# Patient Record
Sex: Female | Born: 1970 | Race: White | Hispanic: No | Marital: Married | Smoking: Never smoker
Health system: Southern US, Community
[De-identification: ages and names within clinical notes are randomized; demographics above are authoritative.]

## PROBLEM LIST (undated history)

## (undated) DIAGNOSIS — F32A Depression, unspecified: Secondary | ICD-10-CM

## (undated) DIAGNOSIS — Z9889 Other specified postprocedural states: Secondary | ICD-10-CM

## (undated) DIAGNOSIS — F329 Major depressive disorder, single episode, unspecified: Secondary | ICD-10-CM

## (undated) DIAGNOSIS — Z87442 Personal history of urinary calculi: Secondary | ICD-10-CM

## (undated) DIAGNOSIS — O021 Missed abortion: Secondary | ICD-10-CM

## (undated) DIAGNOSIS — R011 Cardiac murmur, unspecified: Secondary | ICD-10-CM

## (undated) DIAGNOSIS — K219 Gastro-esophageal reflux disease without esophagitis: Secondary | ICD-10-CM

## (undated) DIAGNOSIS — R112 Nausea with vomiting, unspecified: Secondary | ICD-10-CM

## (undated) DIAGNOSIS — I10 Essential (primary) hypertension: Secondary | ICD-10-CM

## (undated) DIAGNOSIS — N200 Calculus of kidney: Secondary | ICD-10-CM

## (undated) DIAGNOSIS — E785 Hyperlipidemia, unspecified: Secondary | ICD-10-CM

## (undated) DIAGNOSIS — F419 Anxiety disorder, unspecified: Secondary | ICD-10-CM

## (undated) HISTORY — PX: URETHRA SURGERY: SHX824

## (undated) HISTORY — DX: Calculus of kidney: N20.0

## (undated) HISTORY — DX: Essential (primary) hypertension: I10

## (undated) HISTORY — PX: EYE SURGERY: SHX253

## (undated) HISTORY — DX: Hyperlipidemia, unspecified: E78.5

## (undated) HISTORY — DX: Gastro-esophageal reflux disease without esophagitis: K21.9

## (undated) HISTORY — DX: Anxiety disorder, unspecified: F41.9

---
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
## 1998-09-22 HISTORY — PX: LAPAROSCOPIC OVARIAN CYSTECTOMY: SUR786

## 1999-05-15 ENCOUNTER — Emergency Department (HOSPITAL_COMMUNITY): Admission: EM | Admit: 1999-05-15 | Discharge: 1999-05-15 | Payer: Self-pay | Admitting: Emergency Medicine

## 1999-06-28 ENCOUNTER — Ambulatory Visit (HOSPITAL_BASED_OUTPATIENT_CLINIC_OR_DEPARTMENT_OTHER): Admission: RE | Admit: 1999-06-28 | Discharge: 1999-06-28 | Payer: Self-pay | Admitting: Surgery

## 1999-09-23 HISTORY — PX: ENDOMETRIAL ABLATION: SHX621

## 2000-02-14 ENCOUNTER — Inpatient Hospital Stay (HOSPITAL_COMMUNITY): Admission: EM | Admit: 2000-02-14 | Discharge: 2000-02-14 | Payer: Self-pay | Admitting: *Deleted

## 2002-07-08 ENCOUNTER — Inpatient Hospital Stay (HOSPITAL_COMMUNITY): Admission: AD | Admit: 2002-07-08 | Discharge: 2002-07-08 | Payer: Self-pay | Admitting: Family Medicine

## 2003-09-23 HISTORY — PX: ANAL FISTULECTOMY: SHX1139

## 2004-06-27 ENCOUNTER — Ambulatory Visit (HOSPITAL_COMMUNITY): Admission: RE | Admit: 2004-06-27 | Discharge: 2004-06-27 | Payer: Self-pay | Admitting: Obstetrics

## 2004-06-27 IMAGING — CR DG CHEST 2V
2 series · 2 of 2 positions shown · non-contrast
Comparison: none

CLINICAL DATA: Cough.
 PA AND LATERAL CHEST:
 Heart and mediastinal contours are within normal limits.  The lung fields appear clear with no evidence for focal infiltrate or congestive failure.  Bony structures are intact.
 IMPRESSION
 No acute disease.

[view not recorded (1 of 2)]
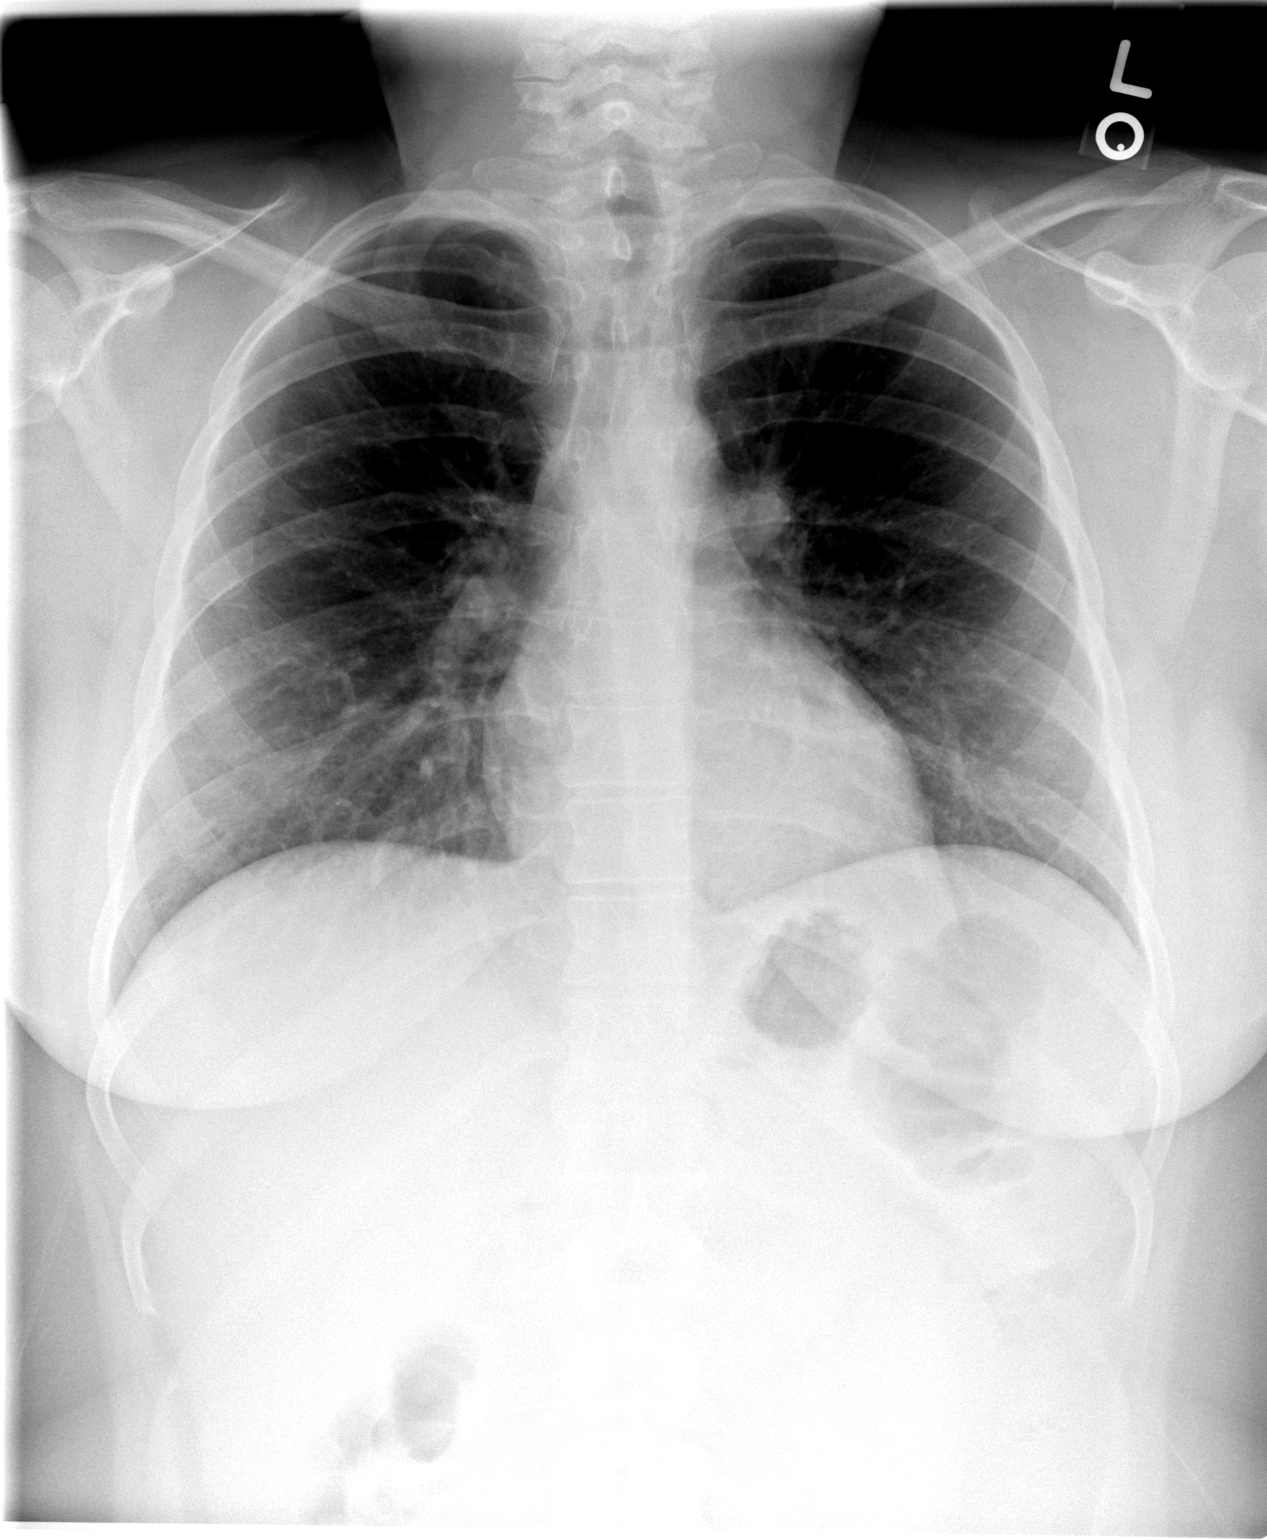

[view not recorded (2 of 2)]
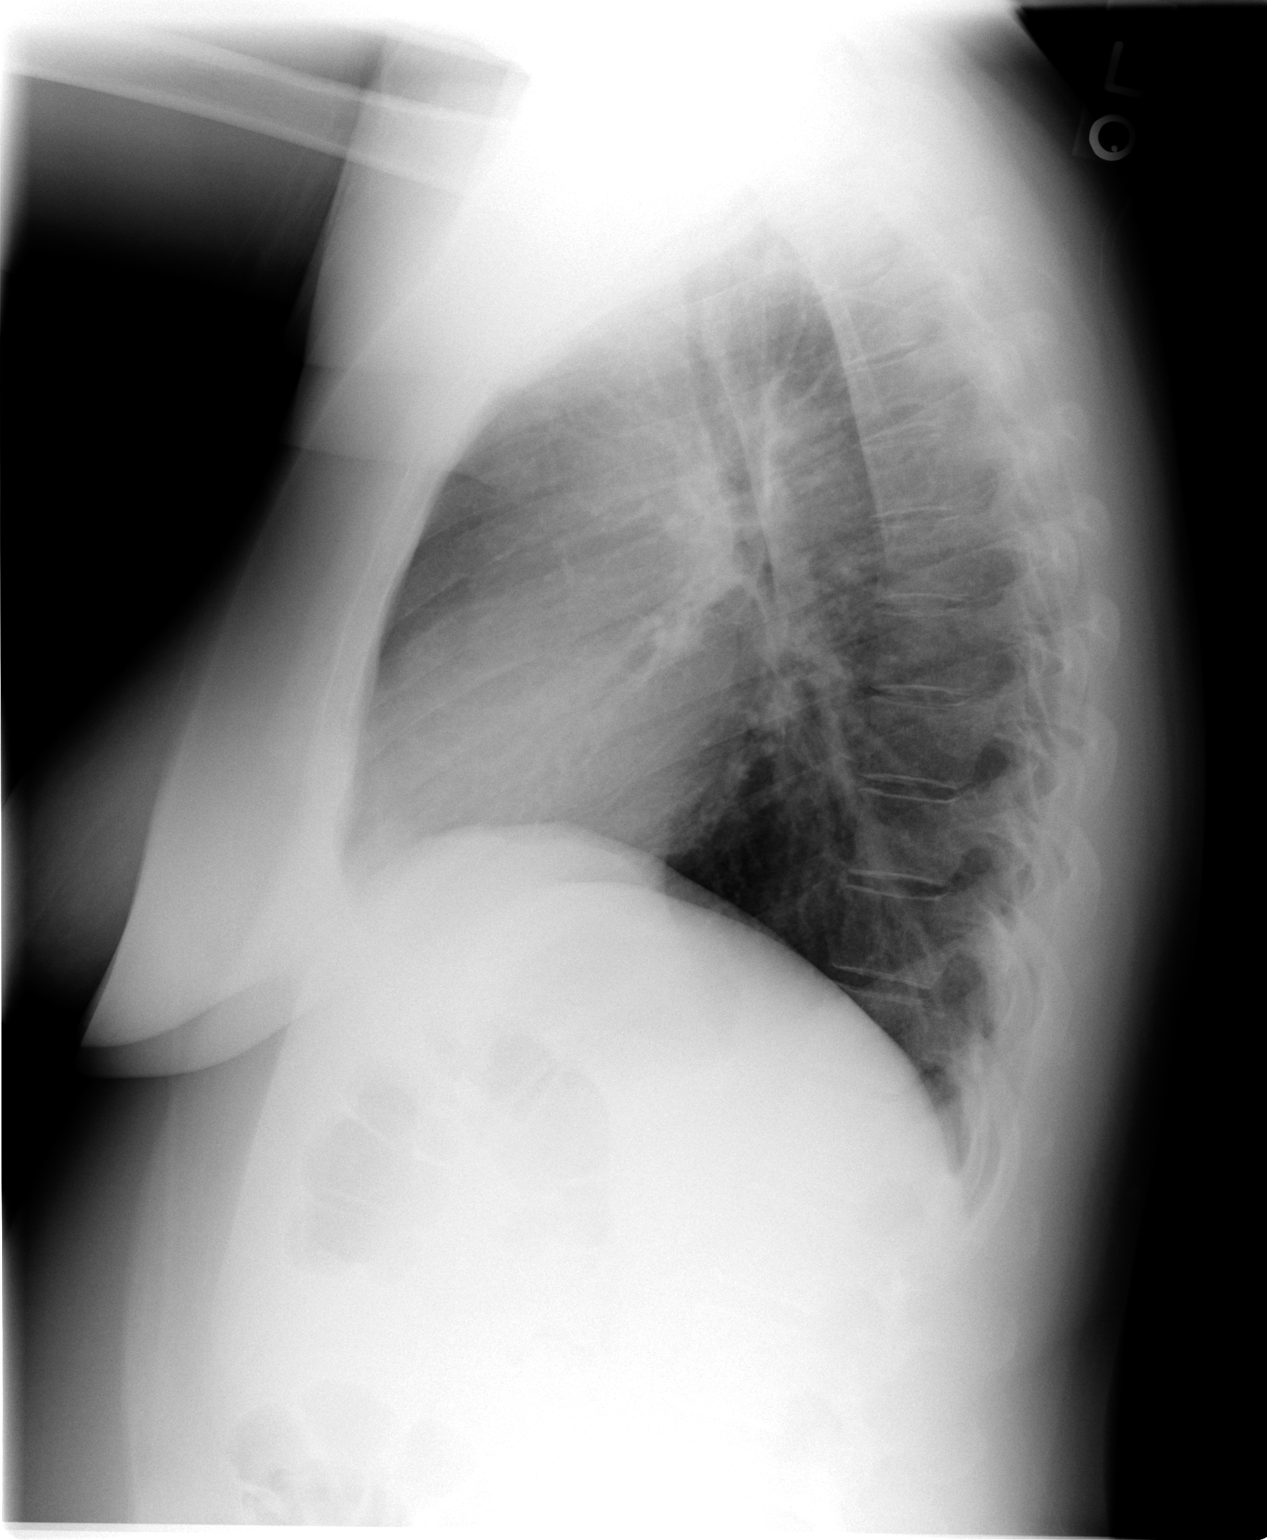

[2 of 2 positions shown; findings below may reference images not displayed]

## 2004-12-13 ENCOUNTER — Ambulatory Visit (HOSPITAL_COMMUNITY): Admission: RE | Admit: 2004-12-13 | Discharge: 2004-12-13 | Payer: Self-pay | Admitting: Gastroenterology

## 2005-02-28 ENCOUNTER — Encounter (INDEPENDENT_AMBULATORY_CARE_PROVIDER_SITE_OTHER): Payer: Self-pay | Admitting: Specialist

## 2005-02-28 ENCOUNTER — Ambulatory Visit (HOSPITAL_COMMUNITY): Admission: RE | Admit: 2005-02-28 | Discharge: 2005-02-28 | Payer: Self-pay | Admitting: Gastroenterology

## 2007-03-23 ENCOUNTER — Ambulatory Visit (HOSPITAL_BASED_OUTPATIENT_CLINIC_OR_DEPARTMENT_OTHER): Admission: RE | Admit: 2007-03-23 | Discharge: 2007-03-23 | Payer: Self-pay | Admitting: Urology

## 2007-09-14 ENCOUNTER — Other Ambulatory Visit: Payer: Self-pay

## 2007-09-14 ENCOUNTER — Emergency Department: Payer: Self-pay | Admitting: Emergency Medicine

## 2008-02-22 ENCOUNTER — Emergency Department (HOSPITAL_COMMUNITY): Admission: EM | Admit: 2008-02-22 | Discharge: 2008-02-22 | Payer: Self-pay | Admitting: Family Medicine

## 2008-09-22 HISTORY — PX: URETHRA SURGERY: SHX824

## 2008-11-23 ENCOUNTER — Ambulatory Visit (HOSPITAL_COMMUNITY): Admission: RE | Admit: 2008-11-23 | Discharge: 2008-11-23 | Payer: Self-pay | Admitting: Obstetrics and Gynecology

## 2008-12-24 ENCOUNTER — Inpatient Hospital Stay (HOSPITAL_COMMUNITY): Admission: AD | Admit: 2008-12-24 | Discharge: 2008-12-24 | Payer: Self-pay | Admitting: Obstetrics and Gynecology

## 2010-01-21 ENCOUNTER — Ambulatory Visit (HOSPITAL_COMMUNITY): Admission: RE | Admit: 2010-01-21 | Discharge: 2010-01-21 | Payer: Self-pay | Admitting: Obstetrics and Gynecology

## 2011-01-13 ENCOUNTER — Other Ambulatory Visit (HOSPITAL_COMMUNITY): Payer: Self-pay | Admitting: Obstetrics and Gynecology

## 2011-01-13 DIAGNOSIS — Z1231 Encounter for screening mammogram for malignant neoplasm of breast: Secondary | ICD-10-CM

## 2011-01-29 ENCOUNTER — Ambulatory Visit (HOSPITAL_COMMUNITY): Payer: Commercial Managed Care - PPO

## 2011-02-04 NOTE — Op Note (Signed)
NAME:  Megan Carrillo, FELTES                  ACCOUNT NO.:  [id number]   MEDICAL RECORD NO.:  [id number]          PATIENT TYPE:  AMB   LOCATION:  [facility]                         FACILITY:  [facility]   PHYSICIAN:  Sigmund I. Patsi Sears, M.D.DATE OF BIRTH:  10-Dec-1970   DATE OF PROCEDURE:  03/23/2007  DATE OF DISCHARGE:                               OPERATIVE REPORT   PREOPERATIVE DIAGNOSIS:  Left lower ureteral calculus.   POSTOPERATIVE DIAGNOSIS:  Left lower ureteral calculus.   OPERATION:  Cystourethroscopy, left retrograde pyelogram with  interpretation, left double-J catheter, balloon dilation of left ureter.   SURGEON:  Sigmund I. Patsi Sears, M.D.   PREPARATION:  Appropriate preanesthesia, the patient is brought to the  operating room, placed on the operating room table in the dorsal supine  position where general LMA anesthesia was induced.  She was then  replaced in the dorsal lithotomy position where the pubis was prepped  with Betadine solution and draped in the usual fashion.   HISTORY:  This 40 year old [employer] employee was originally  evaluated with left flank pain beginning March 16, 2007, with CT scan  showing a 3 mm impacted left lower ureteral calculus.  The patient was  treated with Toradol and Flomax, but was unable to pass the stone.  She  is now for extraction of her stone.  She has continued to have nausea  and continued pain and spasm.   PROCEDURE:  Cystourethroscopy was accomplished and showed that the  patient has a normal appearing bladder.  Left retrograde pyelogram  was  performed which shows an eccentric left ureteral tunnel, and it is  difficult to get into the ureter.  The retrograde was performed and  showed a dilated ureter above the level of the intramural ureter.  No  stone was identified.  The ureteroscope could not be passed through a  very tight intramural ureter.  This was balloon dilated with a 4 cm  balloon dilator for 3 minutes.  The ureteroscope  was then easily passed  into the lower ureter.  Identification of scar and inflammation of the  lower ureter is photodocumented, the upper ureter appeared dilated but  normal.  It was elected to place a double-J catheter.  Therefore, a 24  cm x 4.8 [place] was passed and coiled in the renal pelvis and in the  bladder under fluoroscopic control.  The patient tolerated the procedure  well.  She was awakened after being given Toradol and taken to recovery  in good condition.      Sigmund I. Patsi Sears, M.D.  Electronically Signed     SIT/MEDQ  D:  03/23/2007  T:  03/23/2007  Job:  [id number]

## 2011-02-07 NOTE — Op Note (Signed)
Megan Carrillo, JELINEK                  ACCOUNT NO.:  [id number]   MEDICAL RECORD NO.:  [id number]          PATIENT TYPE:  AMB   LOCATION:  ENDO                         FACILITY:  [facility]   PHYSICIAN:  Bernette Redbird, M.D.   DATE OF BIRTH:  [birth year]   DATE OF PROCEDURE:  02/28/2005  DATE OF DISCHARGE:                                 OPERATIVE REPORT   PROCEDURE:  Upper endoscopy with biopsies.   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   FINDINGS:  Essentially normal exam.   DESCRIPTION OF PROCEDURE:  The nature, purpose and risks of the procedure  had been discussed with the patient who provided written consent. Sedation  for this procedure and the sigmoidoscopy which followed it totaled fentanyl  100 mcg and Versed 10 mg IV without arrhythmias or desaturation. The Olympus  video endoscope was passed under direct vision. The larynx looked normal.  The esophagus was readily entered. There was a minimal esophageal ring above  a very small 1-2 cm hiatal hernia without any evidence of definite reflux  esophagitis and certainly without evidence of active inflammation or  Barrett's esophagus, nor any varices, infection or neoplasia. The stomach  contained no significant residual and appeared to have normal contractility.  In the antrum of the stomach was a slight submucosal fullness or raised area  measuring perhaps 2 cm across with a slit-like dimpled opening as though  this were gastric diverticulum or perhaps a slightly atypical appearance for  a pancreatic rest. Motility through this area was normal suggesting that  there was no significant underlying process causing fixation of the gastric  wall. No gastritis, erosions or ulcers were seen and there were no frank  gastric masses. Retroflexed viewing of the cardia showed just a slightly  patulous diaphragmatic hiatus. The pylorus, duodenal bulb, and  second  duodenum looked normal.   Biopsies were then obtained from the duodenum, from the roof and opening of  the raised area in the antrum, and randomly from the antrum prior to removal  of the scope. The patient tolerated the procedure well and there were no  apparent complications.   IMPRESSION:  1.  Reflux, without any endoscopically-evident adverse sequelae.  2.  Nonspecific abdominal symptoms.  3.  Possible nodule or submucosal lesion such as pancreatic rest in the      antrum of the stomach of dubious clinical relevance.   PLAN:  Await pathology results. Consider trial of prokinetic therapy if the  patient remains symptomatic.      RB/MEDQ  D:  02/28/2005  T:  02/28/2005  Job:  [id number]

## 2011-02-07 NOTE — Op Note (Signed)
. [facility]  Patient:    Megan Carrillo                            MRN: [id number] Proc. Date: 06/28/99 Adm. Date:  [id number] Disc. Date: [id number] Attending:  Charlton Haws                           Operative Report  ACCOUNT [id number]  OFFICE MRN [id number]  PREOPERATIVE DIAGNOSIS:  Fistula in ano.  POSTOPERATIVE DIAGNOSIS:  Fistula in ano.  OPERATION:  Anoscopy and anal fistulotomy.  SURGEON:  Currie Paris, M.D.  ANESTHESIA:  General.  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  She was admitted for surgical correction.  DESCRIPTION OF PROCEDURE:  The patient was brought to the operating room and after satisfactory general anesthesia had been obtained she was placed in the lithotomy position.  Digital rectal examination was unremarkable.  Anoscopy showed some minimal hemorrhoidal disease.  There was a raw surface area on the left anterior side just inside the mucosal border.  At this point I placed a probe into the tract and with some gentle manipulation was able to enter the anal canal in the area where we had noticed the raw surface.  Using coagulation current with cautery I opened the fistula tract over the probe. There was a large amount of chronic granulation tissue present.  This was all curetted out after we had a smooth clean surface.  The tract entered and went through a portion of the sphincter muscle but not completely through it so I did not need to cut completely through the sphincter muscle to achieve control of the fistula.  Once everything appeared to be dry, I injected the area with about 15 cc of plain 0.25% Marcaine with epinephrine for postoperative analgesia.  I then packed it with some Gelfoam.  The patient tolerated the procedure well.  There were no operative complications. DD:   06/28/99 TD:  07/01/99 Job: [id number]

## 2011-02-07 NOTE — Op Note (Signed)
Megan Carrillo, Megan Carrillo                  ACCOUNT NO.:  [id number]   MEDICAL RECORD NO.:  [id number]          PATIENT TYPE:  AMB   LOCATION:  ENDO                         FACILITY:  [facility]   PHYSICIAN:  Bernette Redbird, M.D.   DATE OF BIRTH:  1971/05/17   DATE OF PROCEDURE:  02/28/2005  DATE OF DISCHARGE:                                 OPERATIVE REPORT   PROCEDURE:  Flexible sigmoidoscopy.   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   FINDINGS:  Normal exam.   PROCEDURE:  The nature, purpose, and risks of the procedure have been  discussed with the patient who provided written consent.  The prep was, I  believe, some Fleet enemas the patient had administered at home.  Sedation  for this procedure and the upper endoscopy which preceded it totaled  fentanyl 100 mcg and Versed 10 mg IV without arrhythmias or desaturation.  The Olympus video endoscope which had been used for her upper procedure was  also used for this exam and was advanced easily was to about 58 cm,  whereupon pullback was initiated.  The quality of the prep was excellent,  and it was felt that all areas were well seen.   This was a normal examination.  There was no evidence of proctitis or  colitis and, in fact, the vascular pattern of the rectosigmoid and colonic  mucosa was very normal and clear.  In the rectum, there was some minimal  contact hemorrhage.  No granularity or exudate were observed.  Retroflexion  was unremarkable.  Pullout through the anal canal demonstrated just minimal  internal hemorrhoids.   No biopsies were obtained.  The patient tolerated the procedure well, and  there no apparent complications.   IMPRESSION:  Normal flexible sigmoidoscopy, without source of rectal  bleeding identified (569.3).  The bleeding was presumably of hemorrhoidal  origin.   PLAN:  No follow-up for this condition needed.       RB/MEDQ  D:  02/28/2005  T:  02/28/2005  Job:   [id number]

## 2011-02-21 ENCOUNTER — Ambulatory Visit (HOSPITAL_COMMUNITY): Payer: Commercial Managed Care - PPO | Attending: Obstetrics and Gynecology

## 2011-07-08 LAB — POCT PREGNANCY, URINE
Operator id: 268271
Preg Test, Ur: NEGATIVE

## 2011-07-08 LAB — POCT HEMOGLOBIN-HEMACUE
Hemoglobin: 13.4
Operator id: 268271

## 2011-12-22 ENCOUNTER — Encounter (HOSPITAL_COMMUNITY): Payer: Self-pay | Admitting: *Deleted

## 2011-12-22 ENCOUNTER — Emergency Department (INDEPENDENT_AMBULATORY_CARE_PROVIDER_SITE_OTHER): Payer: Commercial Managed Care - PPO

## 2011-12-22 ENCOUNTER — Emergency Department (INDEPENDENT_AMBULATORY_CARE_PROVIDER_SITE_OTHER)
Admission: EM | Admit: 2011-12-22 | Discharge: 2011-12-22 | Disposition: A | Discharge status: Home / Self Care | Payer: PRIVATE HEALTH INSURANCE | Source: Home / Self Care | Attending: Family Medicine | Admitting: Family Medicine

## 2011-12-22 ENCOUNTER — Emergency Department (HOSPITAL_COMMUNITY): Payer: Self-pay

## 2011-12-22 DIAGNOSIS — S6000XA Contusion of unspecified finger without damage to nail, initial encounter: Secondary | ICD-10-CM

## 2011-12-22 DIAGNOSIS — X500XXA Overexertion from strenuous movement or load, initial encounter: Secondary | ICD-10-CM

## 2011-12-22 NOTE — ED Provider Notes (Signed)
History     CSN: [id number]  Arrival date & time 12/22/11  1478   First MD Initiated Contact with Patient 12/22/11 [phone]      No chief complaint on file.   (Consider location/radiation/quality/duration/timing/severity/associated sxs/prior treatment) HPI Comments: Megan Carrillo presents for evaluation of worsening pain, swelling, and bruising in her left fifth digit of her hand. She reports, that she hyperflexed it as she was reaching for a door on Friday night. She's been taking Advil since that time with minimal improvement in pain. She reports most of the pain is at the PIP joint. There is no bruising on the dorsum of the PIP joint.  Patient is a 41 y.o. female presenting with hand pain. The history is provided by the patient.  Hand Pain This is a new problem. The current episode started more than 2 days ago. The problem occurs constantly. The problem has been gradually worsening. The symptoms are aggravated by bending. The symptoms are relieved by nothing.    No past medical history on file.  No past surgical history on file.  No family history on file.  History  Substance Use Topics  . Smoking status: Not on file  . Smokeless tobacco: Not on file  . Alcohol Use: Not on file    OB History    No data available      Review of Systems  Constitutional: Negative.   HENT: Negative.   Eyes: Negative.   Respiratory: Negative.   Cardiovascular: Negative.   Gastrointestinal: Negative.   Genitourinary: Negative.   Musculoskeletal: Negative.        Finger pain, LEFT 5th digit  Skin: Negative.   Neurological: Negative.     Allergies  Review of patient's allergies indicates not on file.  Home Medications  No current outpatient prescriptions on file.  BP 128/80  Pulse 94  Temp(Src) 97.9 F (36.6 C) (Oral)  Resp 14  SpO2 99%  Physical Exam  Nursing note and vitals reviewed. Constitutional: She is oriented to person, place, and time. She appears well-developed and  well-nourished.  HENT:  Head: Normocephalic and atraumatic.  Eyes: EOM are normal.  Neck: Normal range of motion.  Pulmonary/Chest: Effort normal.  Musculoskeletal: Normal range of motion.       Left hand: She exhibits tenderness and bony tenderness. She exhibits normal range of motion and normal capillary refill.       Hands: Neurological: She is alert and oriented to person, place, and time.  Skin: Skin is warm and dry.  Psychiatric: Her behavior is normal.    ED Course  Procedures (including critical care time)  Labs Reviewed - No data to display No results found.   No diagnosis found.    MDM  Xray reviewed by radiologist and myself; no acute fracture or dislocation; likely contusion, given splint        Renaee Munda, MD 12/22/11 1023

## 2011-12-22 NOTE — Discharge Instructions (Signed)
Your xray was negative for any fracture or dislocation. Wear splint with activity for the next 48 to 72 hours. Remove and assess pain with activity. If pain is improved or resolved, discontinue splint. If pain persists, continue splint, with activity, until pain-free. If pain persists longer than 2 to 3 weeks, return for re-evaluation. Use an over the counter anti-inflammatory such as Aleve, one to two tablets with meals, every 12 hours, or high-dose ibuprofen, 800 mg every 8 hours, for baseline pain control.

## 2011-12-22 NOTE — ED Notes (Signed)
Pt states that she jammed her left little finger on a door at work on Saturday night.  States she's tried Advil and splinting without relief.  Having throbbing pain and occasional sharp, shooting pain in finger.  Minimal swelling noted.  No obvious deformity noted.

## 2012-01-15 ENCOUNTER — Other Ambulatory Visit: Payer: Self-pay | Admitting: Gastroenterology

## 2012-01-15 ENCOUNTER — Other Ambulatory Visit (HOSPITAL_COMMUNITY): Payer: Self-pay | Admitting: Gastroenterology

## 2012-01-15 DIAGNOSIS — R1013 Epigastric pain: Secondary | ICD-10-CM

## 2012-01-20 ENCOUNTER — Ambulatory Visit (HOSPITAL_COMMUNITY)
Admission: RE | Admit: 2012-01-20 | Discharge: 2012-01-20 | Disposition: A | Discharge status: Home / Self Care | Payer: 59 | Source: Ambulatory Visit | Attending: Gastroenterology | Admitting: Gastroenterology

## 2012-01-20 DIAGNOSIS — R1013 Epigastric pain: Secondary | ICD-10-CM | POA: Insufficient documentation

## 2012-01-20 DIAGNOSIS — R1110 Vomiting, unspecified: Secondary | ICD-10-CM | POA: Insufficient documentation

## 2012-01-27 ENCOUNTER — Telehealth (INDEPENDENT_AMBULATORY_CARE_PROVIDER_SITE_OTHER): Payer: Self-pay | Admitting: Surgery

## 2012-01-27 NOTE — Telephone Encounter (Signed)
Patient having generalized abdominal pain. Has been seeing Dr Matthias Hughs has had an [place] of her gallbladder that showed no gallstones. They have ruled out kidney stones. She has had an echo which was negative. She has a follow up with Dr Matthias Hughs on 02/12/12. I advised patient to keep appt with him and if they find a surgical problem we would be happy to see her. She understands and will call if needed.

## 2012-12-23 ENCOUNTER — Emergency Department: Payer: Self-pay | Admitting: Emergency Medicine

## 2012-12-23 LAB — COMPREHENSIVE METABOLIC PANEL
Albumin: 3.7 g/dL (ref 3.4–5.0)
Alkaline Phosphatase: 56 U/L (ref 50–136)
Anion Gap: 7 (ref 7–16)
BUN: 8 mg/dL (ref 7–18)
Bilirubin,Total: 0.5 mg/dL (ref 0.2–1.0)
Calcium, Total: 8.7 mg/dL (ref 8.5–10.1)
Chloride: 105 mmol/L (ref 98–107)
Co2: 26 mmol/L (ref 21–32)
Creatinine: 0.69 mg/dL (ref 0.60–1.30)
EGFR (African American): >60
EGFR (Non-African Amer.): >60
Glucose: 93 mg/dL (ref 65–99)
Osmolality: 274 (ref 275–301)
Potassium: 3.6 mmol/L (ref 3.5–5.1)
SGOT(AST): 12 U/L — ABNORMAL LOW (ref 15–37)
SGPT (ALT): 23 U/L (ref 12–78)
Sodium: 138 mmol/L (ref 136–145)
Total Protein: 8.1 g/dL (ref 6.4–8.2)

## 2012-12-23 LAB — PREGNANCY, URINE: Pregnancy Test, Urine: NEGATIVE m[IU]/mL

## 2012-12-23 LAB — URINALYSIS, COMPLETE
Bacteria: NONE SEEN
Bilirubin,UR: NEGATIVE
Blood: NEGATIVE
Glucose,UR: NEGATIVE mg/dL (ref 0–75)
Ketone: NEGATIVE
Leukocyte Esterase: NEGATIVE
Nitrite: NEGATIVE
Ph: 6 (ref 4.5–8.0)
Protein: NEGATIVE
RBC,UR: 1 /HPF (ref 0–5)
Specific Gravity: 1.021 (ref 1.003–1.030)
Squamous Epithelial: 1
WBC UR: 1 /HPF (ref 0–5)

## 2012-12-23 LAB — CBC
HCT: 40.5 % (ref 35.0–47.0)
HGB: 13.9 g/dL (ref 12.0–16.0)
MCH: 29 pg (ref 26.0–34.0)
MCHC: 34.4 g/dL (ref 32.0–36.0)
MCV: 84 fL (ref 80–100)
Platelet: 288 10*3/uL (ref 150–440)
RBC: 4.81 10*6/uL (ref 3.80–5.20)
RDW: 13.1 % (ref 11.5–14.5)
WBC: 8.2 10*3/uL (ref 3.6–11.0)

## 2012-12-23 LAB — LIPASE, BLOOD: Lipase: 145 U/L (ref 73–393)

## 2013-02-27 ENCOUNTER — Emergency Department: Payer: Self-pay | Admitting: Emergency Medicine

## 2013-07-08 ENCOUNTER — Other Ambulatory Visit (HOSPITAL_COMMUNITY): Payer: Self-pay | Admitting: Obstetrics and Gynecology

## 2013-07-08 DIAGNOSIS — Z1231 Encounter for screening mammogram for malignant neoplasm of breast: Secondary | ICD-10-CM

## 2013-11-03 ENCOUNTER — Ambulatory Visit (HOSPITAL_COMMUNITY): Payer: 59 | Attending: Obstetrics and Gynecology

## 2014-09-28 ENCOUNTER — Inpatient Hospital Stay (HOSPITAL_COMMUNITY)
Admission: AD | Admit: 2014-09-28 | Discharge: 2014-09-28 | Discharge status: Against Medical Advice | Payer: BLUE CROSS/BLUE SHIELD | Source: Ambulatory Visit | Attending: Obstetrics and Gynecology | Admitting: Obstetrics and Gynecology

## 2014-09-28 DIAGNOSIS — Z5321 Procedure and treatment not carried out due to patient leaving prior to being seen by health care provider: Secondary | ICD-10-CM | POA: Insufficient documentation

## 2014-09-28 DIAGNOSIS — R109 Unspecified abdominal pain: Secondary | ICD-10-CM | POA: Insufficient documentation

## 2014-09-28 DIAGNOSIS — N809 Endometriosis, unspecified: Secondary | ICD-10-CM | POA: Insufficient documentation

## 2014-09-28 LAB — URINALYSIS, ROUTINE W REFLEX MICROSCOPIC
Bilirubin Urine: NEGATIVE
Glucose, UA: NEGATIVE mg/dL
Hgb urine dipstick: NEGATIVE
Ketones, ur: NEGATIVE mg/dL
Leukocytes, UA: NEGATIVE
Nitrite: NEGATIVE
Protein, ur: NEGATIVE mg/dL
Specific Gravity, Urine: <1.005 — ABNORMAL LOW (ref 1.005–1.030)
Urobilinogen, UA: 0.2 mg/dL (ref 0.0–1.0)
pH: 6 (ref 5.0–8.0)

## 2014-09-28 LAB — POCT PREGNANCY, URINE: Preg Test, Ur: NEGATIVE

## 2014-09-28 NOTE — MAU Note (Signed)
Sudden onset of mid & lower abd pain 1 1/2 hours ago.  Feeling nauseated, having rectal pressure.  Denies vomiting or diarrhea.

## 2014-09-28 NOTE — MAU Note (Signed)
Pt states she is on continuous birth control for endometriosis.

## 2014-09-28 NOTE — MAU Note (Signed)
Pt states she wants to leave due to wait time & states she is feeling better.  AMA form signed.

## 2015-01-18 ENCOUNTER — Encounter (HOSPITAL_COMMUNITY): Payer: Self-pay | Admitting: *Deleted

## 2015-01-18 ENCOUNTER — Emergency Department (HOSPITAL_COMMUNITY): Payer: BC Managed Care – PPO

## 2015-01-18 ENCOUNTER — Emergency Department (HOSPITAL_COMMUNITY)
Admission: EM | Admit: 2015-01-18 | Discharge: 2015-01-18 | Disposition: A | Discharge status: Home / Self Care | Payer: BC Managed Care – PPO | Attending: Emergency Medicine | Admitting: Emergency Medicine

## 2015-01-18 DIAGNOSIS — R51 Headache: Secondary | ICD-10-CM | POA: Diagnosis not present

## 2015-01-18 DIAGNOSIS — K219 Gastro-esophageal reflux disease without esophagitis: Secondary | ICD-10-CM | POA: Diagnosis not present

## 2015-01-18 DIAGNOSIS — R42 Dizziness and giddiness: Secondary | ICD-10-CM | POA: Insufficient documentation

## 2015-01-18 DIAGNOSIS — R531 Weakness: Secondary | ICD-10-CM | POA: Insufficient documentation

## 2015-01-18 DIAGNOSIS — R079 Chest pain, unspecified: Secondary | ICD-10-CM | POA: Insufficient documentation

## 2015-01-18 DIAGNOSIS — R200 Anesthesia of skin: Secondary | ICD-10-CM | POA: Diagnosis not present

## 2015-01-18 DIAGNOSIS — Z8742 Personal history of other diseases of the female genital tract: Secondary | ICD-10-CM | POA: Diagnosis not present

## 2015-01-18 DIAGNOSIS — R011 Cardiac murmur, unspecified: Secondary | ICD-10-CM | POA: Diagnosis not present

## 2015-01-18 HISTORY — DX: Cardiac murmur, unspecified: R01.1

## 2015-01-18 LAB — CBC WITH DIFFERENTIAL/PLATELET
Basophils Absolute: 0 10*3/uL (ref 0.0–0.1)
Basophils Relative: 0 % (ref 0–1)
Eosinophils Absolute: 0 10*3/uL (ref 0.0–0.7)
Eosinophils Relative: 1 % (ref 0–5)
HCT: 37.6 % (ref 36.0–46.0)
Hemoglobin: 12.9 g/dL (ref 12.0–15.0)
Lymphocytes Relative: 19 % (ref 12–46)
Lymphs Abs: 1.3 10*3/uL (ref 0.7–4.0)
MCH: 28.8 pg (ref 26.0–34.0)
MCHC: 34.3 g/dL (ref 30.0–36.0)
MCV: 83.9 fL (ref 78.0–100.0)
Monocytes Absolute: 0.4 10*3/uL (ref 0.1–1.0)
Monocytes Relative: 6 % (ref 3–12)
Neutro Abs: 5.2 10*3/uL (ref 1.7–7.7)
Neutrophils Relative %: 74 % (ref 43–77)
Platelets: 272 10*3/uL (ref 150–400)
RBC: 4.48 MIL/uL (ref 3.87–5.11)
RDW: 12.6 % (ref 11.5–15.5)
WBC: 7 10*3/uL (ref 4.0–10.5)

## 2015-01-18 LAB — COMPREHENSIVE METABOLIC PANEL
ALT: 13 U/L (ref 0–35)
AST: 14 U/L (ref 0–37)
Albumin: 3.6 g/dL (ref 3.5–5.2)
Alkaline Phosphatase: 41 U/L (ref 39–117)
Anion gap: 8 (ref 5–15)
BUN: <5 mg/dL — ABNORMAL LOW (ref 6–23)
CO2: 24 mmol/L (ref 19–32)
Calcium: 9.2 mg/dL (ref 8.4–10.5)
Chloride: 106 mmol/L (ref 96–112)
Creatinine, Ser: 0.78 mg/dL (ref 0.50–1.10)
GFR calc Af Amer: >90 mL/min (ref >90)
GFR calc non Af Amer: >90 mL/min (ref >90)
Glucose, Bld: 95 mg/dL (ref 70–99)
Potassium: 3.6 mmol/L (ref 3.5–5.1)
Sodium: 138 mmol/L (ref 135–145)
Total Bilirubin: 0.2 mg/dL — ABNORMAL LOW (ref 0.3–1.2)
Total Protein: 7 g/dL (ref 6.0–8.3)

## 2015-01-18 LAB — I-STAT TROPONIN, ED
Troponin i, poc: 0 ng/mL (ref 0.00–0.08)
Troponin i, poc: 0 ng/mL (ref 0.00–0.08)

## 2015-01-18 MED ORDER — IBUPROFEN 200 MG PO TABS
600.0000 mg | ORAL_TABLET | Freq: Once | ORAL | Status: AC
  Administered 2015-01-18: 600 mg via ORAL
  Filled 2015-01-18: qty 3

## 2015-01-18 NOTE — ED Notes (Signed)
Per EMS pt from doctors office with c/o numbness in right arm x a couple of weeks intermittently. Dizzines began today. VSS. CBG 89. Stroke screen negative. Doctor concerned about TIA.

## 2015-01-18 NOTE — ED Provider Notes (Signed)
CSN: [id number]     Arrival date & time 01/18/15  1353 History   First MD Initiated Contact with Patient 01/18/15 1357     Chief Complaint  Patient presents with  . Numbness     (Consider location/radiation/quality/duration/timing/severity/associated sxs/prior Treatment) HPI Megan Carrillo is a 44 y.o. female with history of acid reflux, otherwise healthy, presents to emergency department complaining of intermittent right arm numbness and weakness for the last several weeks. She states symptoms started with "tingling sensation" in the hand. States sometimes it wakes her up from sleep, but sometimes it is during the day. States symptoms have gradually been getting worse. She made an apt with orthopedics specialist which is tomorrow. Today she states she noticed that weakness in that hand is not going away and she felt dizzy, had slight headache, and developed chest tightness. This made her want to go see her doctor today. Pt denies prior cardiac or vascular hx. She does have family hx of cardiac disease in her father. She states she has been under a lot of stress recently. States she lost her mother few weeks ago who passed away from cancer, and her father had open heart surgery. Pt states she had some work related stress as well. Pt denies any arm pain. Denies any neck pain. No neck injuries. No prior similar symptoms.   Past Medical History  Diagnosis Date  . Acid reflux   . Endometriosis   . Heart murmur    Past Surgical History  Procedure Laterality Date  . Ovarian cyst removal    . Eye surgery     No family history on file. History  Substance Use Topics  . Smoking status: Never Smoker   . Smokeless tobacco: Not on file  . Alcohol Use: No   OB History    No data available     Review of Systems  Constitutional: Negative for fever and chills.  Respiratory: Positive for chest tightness. Negative for cough and shortness of breath.   Cardiovascular: Negative for chest pain,  palpitations and leg swelling.  Gastrointestinal: Negative for nausea, vomiting, abdominal pain and diarrhea.  Genitourinary: Negative for dysuria and flank pain.  Musculoskeletal: Negative for myalgias, arthralgias, neck pain and neck stiffness.  Skin: Negative for rash.  Neurological: Positive for dizziness, weakness, light-headedness, numbness and headaches.  All other systems reviewed and are negative.     Allergies  Sulfa antibiotics  Home Medications   Prior to Admission medications   Medication Sig Start Date End Date Taking? Authorizing Provider  B Complex Vitamins (VITAMIN B COMPLEX PO) Take 1 tablet by mouth at bedtime.    Yes Historical Provider, MD  cetirizine (ZYRTEC) 10 MG tablet Take 10 mg by mouth at bedtime.    Yes Historical Provider, MD  cholecalciferol (VITAMIN D) 1000 UNITS tablet Take 1,000 Units by mouth at bedtime.   Yes Historical Provider, MD  ibuprofen (ADVIL,MOTRIN) 200 MG tablet Take 200 mg by mouth every 6 (six) hours as needed for moderate pain.   Yes Historical Provider, MD  Norgestimate-Eth Estradiol (MONONESSA PO) Take by mouth 1 day or 1 dose.   Yes Historical Provider, MD  pantoprazole (PROTONIX) 40 MG tablet Take 40 mg by mouth at bedtime.   Yes Historical Provider, MD   BP 119/57 mmHg  Pulse 87  Temp(Src) 98.4 F (36.9 C) (Oral)  Resp 18  SpO2 99% Physical Exam  Constitutional: She is oriented to person, place, and time. She appears well-developed and well-nourished. No distress.  HENT:  Head: Normocephalic.  Eyes: Conjunctivae are normal.  Neck: Neck supple.  Cardiovascular: Normal rate, regular rhythm and normal heart sounds.   Pulmonary/Chest: Effort normal and breath sounds normal. No respiratory distress. She has no wheezes. She has no rales.  Abdominal: Soft. Bowel sounds are normal. She exhibits no distension. There is no tenderness. There is no rebound.  Musculoskeletal: She exhibits no edema.  Full rom of right arm at all joints.  No ttp over cervical spine  Neurological: She is alert and oriented to person, place, and time.  5/5 and equal upper and lower extremity strength bilaterally. 5/5 strength of right deltoid, right bicep, right triceps, right forearm muscles, right grip. Equal grip strength bilaterally. Sensation intact in right arm. Normal finger to nose and heel to shin. No pronator drift.   Skin: Skin is warm and dry.  Psychiatric: She has a normal mood and affect. Her behavior is normal.  Nursing note and vitals reviewed.   ED Course  Procedures (including critical care time) Labs Review Labs Reviewed  CBC WITH DIFFERENTIAL/PLATELET  COMPREHENSIVE METABOLIC PANEL  I-STAT TROPOININ, ED    Imaging Review No results found.   EKG Interpretation   Date/Time:  Thursday January 18 2015 14:54:04 EDT Ventricular Rate:  87 PR Interval:  143 QRS Duration: 85 QT Interval:  387 QTC Calculation: 466 R Axis:   2 Text Interpretation:  Sinus rhythm No old tracing to compare Confirmed by  BELFI  MD, MELANIE ([id number]) on 01/18/2015 2:56:46 PM      MDM   Final diagnoses:  Right arm numbness  Dizziness    Pt with intermittent numbness and weakness in right arm. States now feels like hand is constantly weak. Exam with no deficits. Discussed with dr. Fredderick PhenixBelfi, will get MR brain. Doubt bleed given symptoms for several weeks. Other differential includes cervical radiculopathy, however maybe unlikely since symptoms are not dermatomal. Also question carpal tunnel.  Pt appears very anxious and states she is under a lot of stress.    4:17 PM Labs all normal. Pending MR. [facility] pending MR and delta trop.   Jaynie Crumbleatyana Pasqual Farias, PA-C 01/19/15 [id number]  Rolan BuccoMelanie Belfi, MD 01/21/15 [phone]

## 2015-01-18 NOTE — Discharge Instructions (Signed)
Carpal Tunnel Syndrome °Carpal tunnel syndrome is a disorder of the nervous system in the wrist that causes pain, hand weakness, and/or loss of feeling. Carpal tunnel syndrome is caused by the compression, stretching, or irritation of the median nerve at the wrist joint. Athletes who experience carpal tunnel syndrome may notice a decrease in their performance to the condition, especially for sports that require strong hand or wrist action.  °SYMPTOMS  °· Tingling, numbness, or burning pain in the hand or fingers. °· Inability to sleep due to pain in the hand. °· Sharp pains that shoot from the wrist up the arm or to the fingers, especially at night. °· Morning stiffness or cramping of the hand. °· Thumb weakness, resulting in difficulty holding objects or making a fist. °· Shiny, dry skin on the hand. °· Reduced performance in any sport requiring a strong grip. °CAUSES  °· Median nerve damage at the wrist is caused by pressure due to swelling, inflammation, or scarred tissue. °· Sources of pressure include: °¨ Repetitive gripping or squeezing that causes inflammation of the tendon sheaths. °¨ Scarring or shortening of the ligament that covers the median nerve. °¨ Traumatic injury to the wrist or forearm such as fracture, sprain, or dislocation. °¨ Prolonged hyperextension (wrist bent backward) or hyperflexion (wrist bent downward) of the wrist. °RISK INCREASES WITH: °· Diabetes mellitus. °· Menopause or amenorrhea. °· Rheumatoid arthritis. °· Raynaud disease. °· Pregnancy. °· Gout. °· Kidney disease. °· Ganglion cyst. °· Repetitive hand or wrist action. °· Hypothyroidism (underactive thyroid gland). °· Repetitive jolting or shaking of the hands or wrist. °· Prolonged forceful weight-bearing on the hands. °PREVENTION °· Bracing the hand and wrist straight during activities that involve repetitive grasping. °· For activities that require prolonged extension of the wrist (bending towards the top of the forearm)  periodically change the position of your wrists. °· Learn and use proper technique in activities that result in the wrist position in neutral to slight extension. °· Avoid bending the wrist into full extension or flexion (up or down). °· Keep the wrist in a straight (neutral) position. To keep the wrist in this position, wear a splint. °· Avoid repetitive hand and wrist motions. °· When possible avoid prolonged grasping of items (steering wheel of a car, a pen, a vacuum cleaner, or a rake). °· Loosen your grip for activities that require prolonged grasping of items. °· Place keyboards and writing surfaces at the correct height as to decrease strain on the wrist and hand. °· Alternate work tasks to avoid prolonged wrist flexion. °· Avoid pinching activities (needlework and writing) as they may irritate your carpal tunnel syndrome. °· If these activities are necessary, complete them for shorter periods of time. °· When writing, use a felt tip or rollerball pen and/or build up the grip on a pen to decrease the forces required for writing. °PROGNOSIS  °Carpal tunnel syndrome is usually curable with appropriate conservative treatment and sometimes resolves spontaneously. For some cases, surgery is necessary, especially if muscle wasting or nerve changes have developed.  °RELATED COMPLICATIONS  °· Permanent numbness and a weak thumb or fingers in the affected hand. °· Permanent paralysis of a portion of the hand and fingers. °TREATMENT  °Treatment initially consists of stopping activities that aggravate the symptoms as well as medication and ice to reduce inflammation. A wrist splint is often recommended for wear during activities of repetitive motion as well as at night. It is also important to learn and use proper technique when   performing activities that typically cause pain. On occasion, a corticosteroid injection may be given. °If symptoms persist despite conservative treatment, surgery may be an option. Surgical  techniques free the pinched or compressed nerve. Carpal tunnel surgery is usually performed on an outpatient basis, meaning you go home the same day as surgery. These procedures provide almost complete relief of all symptoms in 95% of patients. Expect at least 2 weeks for healing after surgery. For cases that are the result of repeated jolting or shaking of the hand or wrist or prolonged hyperextension, surgery is not usually recommended because stretching of the median nerve, not compression, is usually the cause of carpal tunnel syndrome in these cases. °MEDICATION  °· If pain medication is necessary, nonsteroidal anti-inflammatory medications, such as aspirin and ibuprofen, or other minor pain relievers, such as acetaminophen, are often recommended. °· Do not take pain medication for 7 days before surgery. °· Prescription pain relievers are usually only prescribed after surgery. Use only as directed and only as much as you need. °· Corticosteroid injections may be given to reduce inflammation. However, they are not always recommended. °· Vitamin B6 (pyridoxine) may reduce symptoms; use only if prescribed for your disorder. °SEEK MEDICAL CARE IF:  °· Symptoms get worse or do not improve in 2 weeks despite treatment. °· You also have a current or recent history of neck or shoulder injury that has resulted in pain or tingling elsewhere in your arm. °Document Released: 09/08/2005 Document Revised: 01/23/2014 Document Reviewed: 12/21/2008 °[employer]® Patient Information ©2015 [employer]. This information is not intended to replace advice given to you by your health care provider. Make sure you discuss any questions you have with your health care provider. ° °

## 2015-01-18 NOTE — ED Notes (Signed)
Patient transported to MRI 

## 2015-01-18 NOTE — ED Provider Notes (Signed)
4:08 PM Pt signed out by Orlean Bradfordatyana Kirchenko, PA-C at shift change. Plan is to f/u on MRI brain to r/o stroke as well as delta troponin.  If both unremarkable, pt may be discharged home to f/u with PCP as well as orthopedist tomorrow as previously scheduled for possible carpal tunnel syndrome.   MRI: no acute intracranial abnormality. Mild cerebral atrophy.   Troponin: negative for elevation.  Discussed imaging and troponin with pt. Pt states she feels comfortable being discharged home. Advised to f/u with PCP as well as orthopedist as scheduled for tomorrow. Home care instructions provided. Return precautions provided. Pt verbalized understanding and agreement with tx plan.   Junius Finnerrin O'Malley, PA-C 01/18/15 1731  Blake DivineJohn Wofford, MD 01/20/15 1038

## 2016-04-21 ENCOUNTER — Encounter: Payer: Self-pay | Admitting: Gastroenterology

## 2016-06-10 ENCOUNTER — Encounter: Payer: Self-pay | Admitting: Gastroenterology

## 2016-06-10 ENCOUNTER — Encounter (INDEPENDENT_AMBULATORY_CARE_PROVIDER_SITE_OTHER): Payer: Self-pay

## 2016-06-10 ENCOUNTER — Ambulatory Visit (INDEPENDENT_AMBULATORY_CARE_PROVIDER_SITE_OTHER): Payer: Commercial Managed Care - PPO | Admitting: Gastroenterology

## 2016-06-10 VITALS — BP 104/70 | HR 80 | Ht 64.0 in | Wt 196.2 lb

## 2016-06-10 DIAGNOSIS — K219 Gastro-esophageal reflux disease without esophagitis: Secondary | ICD-10-CM | POA: Diagnosis not present

## 2016-06-10 DIAGNOSIS — K589 Irritable bowel syndrome without diarrhea: Secondary | ICD-10-CM | POA: Diagnosis not present

## 2016-06-10 MED ORDER — RANITIDINE HCL 300 MG PO TABS: 300.0000 mg | ORAL_TABLET | Freq: Every day | ORAL | 11 refills | Status: DC

## 2016-06-10 MED ORDER — PANTOPRAZOLE SODIUM 40 MG PO TBEC: 40.0000 mg | DELAYED_RELEASE_TABLET | Freq: Every day | ORAL | 11 refills | Status: DC

## 2016-06-10 NOTE — Patient Instructions (Addendum)
Use IB Gard 1 capsule twice a day as needed  We will send in your Protonix to your pharmacy   Use Zantac 300 mg at bedtime as needed

## 2016-06-10 NOTE — Progress Notes (Signed)
Megan FoundKaren D Witt-AUTH    [id number]    15-Apr-1971  Primary Care Physician:Stacy Hetty ElyJ Burns, MD  Referring Physician: No referring provider defined for this encounter.  Chief complaint:  Heartburn  HPI: [age] yr F previously followed by Dr Ronney AstersBucchini is here to establish care. She has chronic h/o gerd and IBS with alternating constipation and diarrhea. Symptoms mostly well controlled with PPI once daily on most days and diet. She is planning to do in vitro fertilization and undergoing treatment for that. Denies any nausea, vomiting, abdominal pain, melena or bright red blood per rectum. No dysphagia or odynophagia. Weight is stable. Recently lost her mother to cancer a year ago and she was under tremendous stress during that period. She has also changed jobs and [place]   Outpatient Encounter Prescriptions as of 06/10/2016  Medication Sig  . B Complex Vitamins (VITAMIN B COMPLEX PO) Take 1 tablet by mouth at bedtime.   . cetirizine (ZYRTEC) 10 MG tablet Take 10 mg by mouth at bedtime.   . cholecalciferol (VITAMIN D) 1000 UNITS tablet Take 1,000 Units by mouth at bedtime.  Marland Kitchen. ibuprofen (ADVIL,MOTRIN) 200 MG tablet Take 200 mg by mouth every 6 (six) hours as needed for moderate pain.  . pantoprazole (PROTONIX) 40 MG tablet Take 40 mg by mouth at bedtime.  . Prenatal Vit-Fe Fumarate-FA (PRENATAL VITAMIN PO) Take 1 tablet by mouth daily.  . [DISCONTINUED] Norgestimate-Eth Estradiol (MONONESSA PO) Take by mouth 1 day or 1 dose.   No facility-administered encounter medications on file as of 06/10/2016.     Allergies as of 06/10/2016 - Review Complete 06/10/2016  Allergen Reaction Noted  . Sulfa antibiotics Other (See Comments) 12/22/2011    Past Medical History:  Diagnosis Date  . Anxiety   . Endometriosis   . GERD (gastroesophageal reflux disease)   . Heart murmur   . HLD (hyperlipidemia)   . HTN (hypertension)   . Kidney stones     Past Surgical  History:  Procedure Laterality Date  . ANAL FISTULECTOMY  2005  . ENDOMETRIAL ABLATION Bilateral 2001   with ovarian cyst removal  . EYE SURGERY     BMT  . URETHRA SURGERY  2010   with stent placed, for kidney stones  . URETHRA SURGERY     stent removed    Family History  Problem Relation Age of Onset  . Lymphoma Mother     spleen mets to liver and lungs  . Irritable bowel syndrome Mother   . Basal cell carcinoma Father   . Heart disease Father   . Stroke Father   . Diabetes Father   . Colon polyps Father   . Colon cancer Maternal Grandmother   . Irritable bowel syndrome Maternal Aunt     Social History   Social History  . Marital status: Married    Spouse name: N/A  . Number of children: 3  . Years of education: N/A   Occupational History  . receptionist     oral surgeon   Social History Main Topics  . Smoking status: Never Smoker  . Smokeless tobacco: Never Used  . Alcohol use No  . Drug use: No  . Sexual activity: Yes    Birth control/ protection: Pill   Other Topics Concern  . Not on file   Social History Narrative  . No narrative on file      Review of systems: Review of Systems  Constitutional: Negative for fever and chills.  HENT: Negative.   Eyes: Negative for blurred vision.  Respiratory: Negative for cough, shortness of breath and wheezing.   Cardiovascular: Negative for chest pain and palpitations.  Gastrointestinal: as per HPI Genitourinary: Negative for dysuria, urgency, frequency and hematuria.  Musculoskeletal: Negative for myalgias, back pain and joint pain.  Skin: Negative for itching and rash.  Neurological: Negative for dizziness, tremors, focal weakness, seizures and loss of consciousness.  Endo/Heme/Allergies: Positive for seasonal allergies.  Psychiatric/Behavioral: Negative for depression, suicidal ideas and hallucinations.  All other systems reviewed and are negative.   Physical Exam: Vitals:   06/10/16 1357  BP:  104/70  Pulse: 80   Body mass index is 33.69 kg/m. Gen:      No acute distress HEENT:  EOMI, sclera anicteric Neck:     No masses; no thyromegaly Lungs:    Clear to auscultation bilaterally; normal respiratory effort CV:         Regular rate and rhythm; no murmurs Abd:      + bowel sounds; soft, non-tender; no palpable masses, no distension Ext:    No edema; adequate peripheral perfusion Skin:      Warm and dry; no rash Neuro: alert and oriented x 3 Psych: normal mood and affect  Data Reviewed: Reviewed chart in epic EGD and flex sig by Dr Vincent Peyer 5 years ago unremarkable  Assessment and Plan/Recommendations:  75 yr F with h/o chronic GERD and IBS here to establish care  GERD: Continue Protonix 40mg  daily before breakfast or dinner once daily Zantac 300mg  at bedtime as needed Continue to follow anti reflux measures  IBS: symptoms currently well controlled though does have irregular bowel habits IB guard 1 capsule twice daily as needed  Return in 1 year or sooner if needed  30 minutes was spent face-to-face with the patient. Greater than 50% of the time used for counseling as well as treatment plan and follow-up. She had multiple questions which were answered to her satisfaction  K. Scherry Ran , MD [phone] Mon-Fri 8a-5p [phone] after 5p, weekends, holidays  CC: No ref. provider [place]

## 2016-08-22 ENCOUNTER — Ambulatory Visit (INDEPENDENT_AMBULATORY_CARE_PROVIDER_SITE_OTHER): Payer: Commercial Managed Care - PPO | Admitting: Internal Medicine

## 2016-08-22 ENCOUNTER — Encounter: Payer: Self-pay | Admitting: Internal Medicine

## 2016-08-22 VITALS — BP 114/82 | HR 73 | Temp 98.1°F | Resp 16 | Ht 64.0 in | Wt 194.0 lb

## 2016-08-22 DIAGNOSIS — Z0000 Encounter for general adult medical examination without abnormal findings: Secondary | ICD-10-CM

## 2016-08-22 DIAGNOSIS — R011 Cardiac murmur, unspecified: Secondary | ICD-10-CM | POA: Diagnosis not present

## 2016-08-22 DIAGNOSIS — E559 Vitamin D deficiency, unspecified: Secondary | ICD-10-CM

## 2016-08-22 DIAGNOSIS — K219 Gastro-esophageal reflux disease without esophagitis: Secondary | ICD-10-CM

## 2016-08-22 DIAGNOSIS — Z87442 Personal history of urinary calculi: Secondary | ICD-10-CM

## 2016-08-22 DIAGNOSIS — Z0001 Encounter for general adult medical examination with abnormal findings: Secondary | ICD-10-CM | POA: Diagnosis not present

## 2016-08-22 NOTE — Patient Instructions (Signed)
Test(s) ordered today. Your results will be released to [facility] (or called to you) after review, usually within 72hours after test completion. If any changes need to be made, you will be notified at that same time.  All other Health Maintenance issues reviewed.   All recommended immunizations and age-appropriate screenings are up-to-date or discussed.  flu immunization administered today.   Medications reviewed and updated.  No changes recommended at this time.   An echo was ordered  Please followup in one year for a physical   Health Maintenance, Female Introduction Adopting a healthy lifestyle and getting preventive care can go a long way to promote health and wellness. Talk with your health care provider about what schedule of regular examinations is right for you. This is a good chance for you to check in with your provider about disease prevention and staying healthy. In between checkups, there are plenty of things you can do on your own. Experts have done a lot of research about which lifestyle changes and preventive measures are most likely to keep you healthy. Ask your health care provider for more information. Weight and diet Eat a healthy diet  Be sure to include plenty of vegetables, fruits, low-fat dairy products, and lean protein.  Do not eat a lot of foods high in solid fats, added sugars, or salt.  Get regular exercise. This is one of the most important things you can do for your health.  Most adults should exercise for at least 150 minutes each week. The exercise should increase your heart rate and make you sweat (moderate-intensity exercise).  Most adults should also do strengthening exercises at least twice a week. This is in addition to the moderate-intensity exercise. Maintain a healthy weight  Body mass index (BMI) is a measurement that can be used to identify possible weight problems. It estimates body fat based on height and weight. Your health care provider  can help determine your BMI and help you achieve or maintain a healthy weight.  For females 63 years of age and older:  A BMI below 18.5 is considered underweight.  A BMI of 18.5 to 24.9 is normal.  A BMI of 25 to 29.9 is considered overweight.  A BMI of 30 and above is considered obese. Watch levels of cholesterol and blood lipids  You should start having your blood tested for lipids and cholesterol at 43 years of age, then have this test every 5 years.  You may need to have your cholesterol levels checked more often if:  Your lipid or cholesterol levels are high.  You are older than 43 years of age.  You are at high risk for heart disease. Cancer screening Lung Cancer  Lung cancer screening is recommended for adults 71-51 years old who are at high risk for lung cancer because of a history of smoking.  A yearly low-dose CT scan of the lungs is recommended for people who:  Currently smoke.  Have quit within the past 15 years.  Have at least a 30-pack-year history of smoking. A pack year is smoking an average of one pack of cigarettes a day for 1 year.  Yearly screening should continue until it has been 15 years since you quit.  Yearly screening should stop if you develop a health problem that would prevent you from having lung cancer treatment. Breast Cancer  Practice breast self-awareness. This means understanding how your breasts normally appear and feel.  It also means doing regular breast self-exams. Let your health care  provider know about any changes, no matter how small.  If you are in your 20s or 30s, you should have a clinical breast exam (CBE) by a health care provider every 1-3 years as part of a regular health exam.  If you are 49 or older, have a CBE every year. Also consider having a breast X-ray (mammogram) every year.  If you have a family history of breast cancer, talk to your health care provider about genetic screening.  If you are at high risk for  breast cancer, talk to your health care provider about having an MRI and a mammogram every year.  Breast cancer gene (BRCA) assessment is recommended for women who have family members with BRCA-related cancers. BRCA-related cancers include:  Breast.  Ovarian.  Tubal.  Peritoneal cancers.  Results of the assessment will determine the need for genetic counseling and BRCA1 and BRCA2 testing. Cervical Cancer  Your health care provider may recommend that you be screened regularly for cancer of the pelvic organs (ovaries, uterus, and vagina). This screening involves a pelvic examination, including checking for microscopic changes to the surface of your cervix (Pap test). You may be encouraged to have this screening done every 3 years, beginning at age 14.  For women ages 17-65, health care providers may recommend pelvic exams and Pap testing every 3 years, or they may recommend the Pap and pelvic exam, combined with testing for human papilloma virus (HPV), every 5 years. Some types of HPV increase your risk of cervical cancer. Testing for HPV may also be done on women of any age with unclear Pap test results.  Other health care providers may not recommend any screening for nonpregnant women who are considered low risk for pelvic cancer and who do not have symptoms. Ask your health care provider if a screening pelvic exam is right for you.  If you have had past treatment for cervical cancer or a condition that could lead to cancer, you need Pap tests and screening for cancer for at least 20 years after your treatment. If Pap tests have been discontinued, your risk factors (such as having a new sexual partner) need to be reassessed to determine if screening should resume. Some women have medical problems that increase the chance of getting cervical cancer. In these cases, your health care provider may recommend more frequent screening and Pap tests. Colorectal Cancer  This type of cancer can be  detected and often prevented.  Routine colorectal cancer screening usually begins at 43 years of age and continues through 43 years of age.  Your health care provider may recommend screening at an earlier age if you have risk factors for colon cancer.  Your health care provider may also recommend using home test kits to check for hidden blood in the stool.  A small camera at the end of a tube can be used to examine your colon directly (sigmoidoscopy or colonoscopy). This is done to check for the earliest forms of colorectal cancer.  Routine screening usually begins at age 57.  Direct examination of the colon should be repeated every 5-10 years through 43 years of age. However, you may need to be screened more often if early forms of precancerous polyps or small growths are found. Skin Cancer  Check your skin from head to toe regularly.  Tell your health care provider about any new moles or changes in moles, especially if there is a change in a mole's shape or color.  Also tell your health care  provider if you have a mole that is larger than the size of a pencil eraser.  Always use sunscreen. Apply sunscreen liberally and repeatedly throughout the day.  Protect yourself by wearing long sleeves, pants, a wide-brimmed hat, and sunglasses whenever you are outside. Heart disease, diabetes, and high blood pressure  High blood pressure causes heart disease and increases the risk of stroke. High blood pressure is more likely to develop in:  People who have blood pressure in the high end of the normal range (130-139/85-89 mm Hg).  People who are overweight or obese.  People who are African American.  If you are 59-54 years of age, have your blood pressure checked every 3-5 years. If you are 72 years of age or older, have your blood pressure checked every year. You should have your blood pressure measured twice-once when you are at a hospital or clinic, and once when you are not at a hospital  or clinic. Record the average of the two measurements. To check your blood pressure when you are not at a hospital or clinic, you can use:  An automated blood pressure machine at a pharmacy.  A home blood pressure monitor.  If you are between 68 years and 42 years old, ask your health care provider if you should take aspirin to prevent strokes.  Have regular diabetes screenings. This involves taking a blood sample to check your fasting blood sugar level.  If you are at a normal weight and have a low risk for diabetes, have this test once every three years after 43 years of age.  If you are overweight and have a high risk for diabetes, consider being tested at a younger age or more often. Preventing infection Hepatitis B  If you have a higher risk for hepatitis B, you should be screened for this virus. You are considered at high risk for hepatitis B if:  You were born in a country where hepatitis B is common. Ask your health care provider which countries are considered high risk.  Your parents were born in a high-risk country, and you have not been immunized against hepatitis B (hepatitis B vaccine).  You have HIV or AIDS.  You use needles to inject street drugs.  You live with someone who has hepatitis B.  You have had sex with someone who has hepatitis B.  You get hemodialysis treatment.  You take certain medicines for conditions, including cancer, organ transplantation, and autoimmune conditions. Hepatitis C  Blood testing is recommended for:  Everyone born from [birth year] through 1963.  Anyone with known risk factors for hepatitis C. Sexually transmitted infections (STIs)  You should be screened for sexually transmitted infections (STIs) including gonorrhea and chlamydia if:  You are sexually active and are younger than 43 years of age.  You are older than 43 years of age and your health care provider tells you that you are at risk for this type of infection.  Your sexual  activity has changed since you were last screened and you are at an increased risk for chlamydia or gonorrhea. Ask your health care provider if you are at risk.  If you do not have HIV, but are at risk, it may be recommended that you take a prescription medicine daily to prevent HIV infection. This is called pre-exposure prophylaxis (PrEP). You are considered at risk if:  You are sexually active and do not regularly use condoms or know the HIV status of your partner(s).  You take drugs by injection.  You are sexually active with a partner who has HIV. Talk with your health care provider about whether you are at high risk of being infected with HIV. If you choose to begin PrEP, you should first be tested for HIV. You should then be tested every 3 months for as long as you are taking PrEP. Pregnancy  If you are premenopausal and you may become pregnant, ask your health care provider about preconception counseling.  If you may become pregnant, take 400 to 800 micrograms (mcg) of folic acid every day.  If you want to prevent pregnancy, talk to your health care provider about birth control (contraception). Osteoporosis and menopause  Osteoporosis is a disease in which the bones lose minerals and strength with aging. This can result in serious bone fractures. Your risk for osteoporosis can be identified using a bone density scan.  If you are 48 years of age or older, or if you are at risk for osteoporosis and fractures, ask your health care provider if you should be screened.  Ask your health care provider whether you should take a calcium or vitamin D supplement to lower your risk for osteoporosis.  Menopause may have certain physical symptoms and risks.  Hormone replacement therapy may reduce some of these symptoms and risks. Talk to your health care provider about whether hormone replacement therapy is right for you. Follow these instructions at home:  Schedule regular health, dental, and  eye exams.  Stay current with your immunizations.  Do not use any tobacco products including cigarettes, chewing tobacco, or electronic cigarettes.  If you are pregnant, do not drink alcohol.  If you are breastfeeding, limit how much and how often you drink alcohol.  Limit alcohol intake to no more than 1 drink per day for nonpregnant women. One drink equals 12 ounces of beer, 5 ounces of wine, or 1 ounces of hard liquor.  Do not use street drugs.  Do not share needles.  Ask your health care provider for help if you need support or information about quitting drugs.  Tell your health care provider if you often feel depressed.  Tell your health care provider if you have ever been abused or do not feel safe at home. This information is not intended to replace advice given to you by your health care provider. Make sure you discuss any questions you have with your health care provider. Document Released: 03/24/2011 Document Revised: 02/14/2016 Document Reviewed: 06/12/2015  2017 [employer]vier

## 2016-08-22 NOTE — Progress Notes (Signed)
Subjective:    Patient ID: Megan Carrillo-AUTH, female    DOB: 10-24-1970, 45 y.o.   MRN: [id number]  HPI She is here to establish with a new pcp.  She is here for a physical exam.    She has concerns about her family history of diabetes and her developing diabetes.    She does want to have a child and will likely be undergoing in-vitro.  She has a murmur since childhood and her ob/gyn wants this to be evaluated prior to trying to get pregnant.      Medications and allergies reviewed with patient and updated if appropriate.  Patient Active Problem List   Diagnosis Date Noted  . GERD (gastroesophageal reflux disease) 08/22/2016  . Heart murmur 08/22/2016  . History of nephrolithiasis 08/22/2016  . Vitamin D deficiency 08/22/2016    Current Outpatient Prescriptions on File Prior to Visit  Medication Sig Dispense Refill  . pantoprazole (PROTONIX) 40 MG tablet Take 1 tablet (40 mg total) by mouth at bedtime. 30 tablet 11  . Prenatal Vit-Fe Fumarate-FA (PRENATAL VITAMIN PO) Take 1 tablet by mouth daily.    . ranitidine (ZANTAC) 300 MG tablet Take 1 tablet (300 mg total) by mouth at bedtime. 30 tablet 11   No current facility-administered medications on file prior to visit.     Past Medical History:  Diagnosis Date  . Anxiety   . Endometriosis   . GERD (gastroesophageal reflux disease)   . Heart murmur   . HLD (hyperlipidemia)   . HTN (hypertension)   . Kidney stones     Past Surgical History:  Procedure Laterality Date  . ANAL FISTULECTOMY  2005  . ENDOMETRIAL ABLATION Bilateral 2001   with ovarian cyst removal  . EYE SURGERY     BMT  . URETHRA SURGERY  2010   with stent placed, for kidney stones  . URETHRA SURGERY     stent removed    Social History   Social History  . Marital status: Married    Spouse name: N/A  . Number of children: 3  . Years of education: N/A   Occupational History  . receptionist     oral surgeon   Social History Main Topics  .  Smoking status: Never Smoker  . Smokeless tobacco: Never Used  . Alcohol use No  . Drug use: No  . Sexual activity: Yes    Birth control/ protection: Pill   Other Topics Concern  . None   Social History Narrative   Married, 2 step-kids   Wants to have a child   Works as a Scientist, physiologicalreceptionist at [employer]    Family History  Problem Relation Age of Onset  . Lymphoma Mother     spleen mets to liver and lungs  . Irritable bowel syndrome Mother   . Basal cell carcinoma Father   . Heart disease Father     CABG x 5  . Stroke Father   . Diabetes Father   . Colon polyps Father   . Heart attack Father   . Hyperlipidemia Father   . Hypertension Father   . Colon cancer Maternal Grandmother   . Irritable bowel syndrome Maternal Aunt   . Diabetes Sister   . Hypertension Sister   . Hyperlipidemia Sister   . Polycystic ovary syndrome Sister     Review of Systems  Constitutional: Negative for chills and fever.  Eyes: Negative for visual disturbance.  Respiratory: Negative for cough and shortness  of breath.   Cardiovascular: Positive for chest pain (occ) and leg swelling (if on feet long time). Negative for palpitations.  Gastrointestinal: Positive for abdominal pain (occ, ? related to GERD), anal bleeding (rare with constipation), constipation, diarrhea (overall normal, fluctuates  at times) and nausea. Negative for blood in stool.  Endocrine: Positive for cold intolerance.  Genitourinary: Positive for frequency. Negative for dysuria and hematuria.  Musculoskeletal: Negative for arthralgias.  Neurological: Positive for headaches (tension). Negative for light-headedness.  Psychiatric/Behavioral: Negative for dysphoric mood. The patient is not nervous/anxious.        Objective:   Vitals:   08/22/16 1451  BP: 114/82  Pulse: 73  Resp: 16  Temp: 98.1 F (36.7 C)   Filed Weights   08/22/16 1451  Weight: 194 lb (88 kg)   Body mass index is 33.3 kg/m.   Physical  Exam Constitutional: She appears well-developed and well-nourished. No distress.  HENT:  Head: Normocephalic and atraumatic.  Right Ear: External ear normal. Normal ear canal and TM Left Ear: External ear normal.  Normal ear canal and TM Mouth/Throat: Oropharynx is clear and moist.  Eyes: Conjunctivae and EOM are normal.  Neck: Neck supple. No tracheal deviation present. No thyromegaly present.  No carotid bruit  Cardiovascular: Normal rate, regular rhythm and normal heart sounds.   2/6 systolic murmur murmur heard.  No edema. Pulmonary/Chest: Effort normal and breath sounds normal. No respiratory distress. She has no wheezes. She has no rales.  Breast: deferred to Gyn Abdominal: Soft. She exhibits no distension. There is no tenderness.  Lymphadenopathy: She has no cervical adenopathy.  Skin: Skin is warm and dry. She is not diaphoretic.  Psychiatric: She has a normal mood and affect. Her behavior is normal.         Assessment & Plan:   Physical exam: Screening blood work ordered Immunization  Had flu vaccine, tetanus up to date Mammogram  Up to date  Gyn    Up to date  Exercise  - no regular exercise Weight  - advised weight loss Skin    no concerns Substance abuse  none  See Problem List for Assessment and Plan of chronic medical problems.   F/u annually

## 2016-08-22 NOTE — Progress Notes (Signed)
Pre visit review using our clinic review tool, if applicable. No additional management support is needed unless otherwise documented below in the visit note. 

## 2016-08-23 NOTE — Assessment & Plan Note (Signed)
Check vit D level. 

## 2016-08-23 NOTE — Assessment & Plan Note (Signed)
Audible murmur - has had a murmur since childhood Check echo

## 2016-08-23 NOTE — Assessment & Plan Note (Signed)
GERD controlled Continue daily medication Discussed long term risks of PPI stressed GERD diet/lifestyle Work on weight loss

## 2016-08-26 ENCOUNTER — Other Ambulatory Visit (INDEPENDENT_AMBULATORY_CARE_PROVIDER_SITE_OTHER): Payer: Commercial Managed Care - PPO

## 2016-08-26 DIAGNOSIS — E559 Vitamin D deficiency, unspecified: Secondary | ICD-10-CM | POA: Diagnosis not present

## 2016-08-26 DIAGNOSIS — K219 Gastro-esophageal reflux disease without esophagitis: Secondary | ICD-10-CM

## 2016-08-26 DIAGNOSIS — Z0000 Encounter for general adult medical examination without abnormal findings: Secondary | ICD-10-CM | POA: Diagnosis not present

## 2016-08-26 LAB — COMPREHENSIVE METABOLIC PANEL
ALT: 50 U/L — ABNORMAL HIGH (ref 0–35)
AST: 26 U/L (ref 0–37)
Albumin: 4.2 g/dL (ref 3.5–5.2)
Alkaline Phosphatase: 49 U/L (ref 39–117)
BUN: 11 mg/dL (ref 6–23)
CALCIUM: 9.4 mg/dL (ref 8.4–10.5)
CHLORIDE: 104 meq/L (ref 96–112)
CO2: 28 mEq/L (ref 19–32)
Creatinine, Ser: 0.76 mg/dL (ref 0.40–1.20)
GFR: 87.98 mL/min (ref >60.00)
Glucose, Bld: 108 mg/dL — ABNORMAL HIGH (ref 70–99)
Potassium: 4.1 mEq/L (ref 3.5–5.1)
Sodium: 139 mEq/L (ref 135–145)
Total Bilirubin: 0.5 mg/dL (ref 0.2–1.2)
Total Protein: 7.5 g/dL (ref 6.0–8.3)

## 2016-08-26 LAB — CBC WITH DIFFERENTIAL/PLATELET
BASOS PCT: 0.7 % (ref 0.0–3.0)
Basophils Absolute: 0 10*3/uL (ref 0.0–0.1)
EOS ABS: 0.1 10*3/uL (ref 0.0–0.7)
Eosinophils Relative: 2.1 % (ref 0.0–5.0)
HEMATOCRIT: 40.8 % (ref 36.0–46.0)
Hemoglobin: 13.9 g/dL (ref 12.0–15.0)
Lymphocytes Relative: 28.5 % (ref 12.0–46.0)
Lymphs Abs: 1.7 10*3/uL (ref 0.7–4.0)
MCHC: 34 g/dL (ref 30.0–36.0)
MCV: 87.4 fl (ref 78.0–100.0)
Monocytes Absolute: 0.5 10*3/uL (ref 0.1–1.0)
Monocytes Relative: 7.7 % (ref 3.0–12.0)
NEUTROS ABS: 3.7 10*3/uL (ref 1.4–7.7)
Neutrophils Relative %: 61 % (ref 43.0–77.0)
PLATELETS: 324 10*3/uL (ref 150.0–400.0)
RBC: 4.66 Mil/uL (ref 3.87–5.11)
RDW: 13 % (ref 11.5–15.5)
WBC: 6.1 10*3/uL (ref 4.0–10.5)

## 2016-08-26 LAB — TSH: TSH: 1.2 u[IU]/mL (ref 0.35–4.50)

## 2016-08-26 LAB — LIPID PANEL
Cholesterol: 200 mg/dL (ref 0–200)
HDL: 51.7 mg/dL (ref >39.00)
LDL Cholesterol: 116 mg/dL — ABNORMAL HIGH (ref 0–99)
NonHDL: 148.15
Total CHOL/HDL Ratio: 4
Triglycerides: 159 mg/dL — ABNORMAL HIGH (ref 0.0–149.0)
VLDL: 31.8 mg/dL (ref 0.0–40.0)

## 2016-08-26 LAB — VITAMIN B12: Vitamin B-12: 383 pg/mL (ref 211–911)

## 2016-08-26 LAB — MAGNESIUM: Magnesium: 2 mg/dL (ref 1.5–2.5)

## 2016-08-26 LAB — HEMOGLOBIN A1C: Hgb A1c MFr Bld: 5.4 % (ref 4.6–6.5)

## 2016-08-26 LAB — VITAMIN D 25 HYDROXY (VIT D DEFICIENCY, FRACTURES): VITD: 42.14 ng/mL (ref 30.00–100.00)

## 2016-08-28 ENCOUNTER — Encounter: Payer: Self-pay | Admitting: Internal Medicine

## 2016-08-30 NOTE — Telephone Encounter (Signed)
Does cardiology call her directly?  I told her it usually takes a couple of weeks to hear, but I have had a couple of Echos that have not gotten scheduled and when they return in 6 months I have to order the test again.

## 2016-10-03 ENCOUNTER — Ambulatory Visit (HOSPITAL_COMMUNITY): Payer: Commercial Managed Care - PPO | Attending: Cardiology

## 2016-10-03 ENCOUNTER — Other Ambulatory Visit: Payer: Self-pay

## 2016-10-03 DIAGNOSIS — E785 Hyperlipidemia, unspecified: Secondary | ICD-10-CM | POA: Diagnosis not present

## 2016-10-03 DIAGNOSIS — R011 Cardiac murmur, unspecified: Secondary | ICD-10-CM | POA: Diagnosis present

## 2016-10-03 DIAGNOSIS — F419 Anxiety disorder, unspecified: Secondary | ICD-10-CM | POA: Insufficient documentation

## 2016-10-03 DIAGNOSIS — I10 Essential (primary) hypertension: Secondary | ICD-10-CM | POA: Insufficient documentation

## 2016-10-04 ENCOUNTER — Encounter: Payer: Self-pay | Admitting: Internal Medicine

## 2016-10-31 ENCOUNTER — Encounter: Payer: Self-pay | Admitting: Family

## 2016-11-03 ENCOUNTER — Ambulatory Visit: Payer: Self-pay | Admitting: Family

## 2016-11-04 ENCOUNTER — Ambulatory Visit: Payer: Self-pay | Admitting: Family

## 2016-11-18 ENCOUNTER — Encounter: Payer: Self-pay | Admitting: Internal Medicine

## 2016-11-18 ENCOUNTER — Ambulatory Visit (INDEPENDENT_AMBULATORY_CARE_PROVIDER_SITE_OTHER): Payer: Commercial Managed Care - PPO | Admitting: Internal Medicine

## 2016-11-18 DIAGNOSIS — R6883 Chills (without fever): Secondary | ICD-10-CM | POA: Diagnosis not present

## 2016-11-18 DIAGNOSIS — R5383 Other fatigue: Secondary | ICD-10-CM | POA: Diagnosis not present

## 2016-11-18 DIAGNOSIS — R69 Illness, unspecified: Principal | ICD-10-CM

## 2016-11-18 DIAGNOSIS — J111 Influenza due to unidentified influenza virus with other respiratory manifestations: Secondary | ICD-10-CM

## 2016-11-18 MED ORDER — HYDROCODONE-HOMATROPINE 5-1.5 MG/5ML PO SYRP: 5.0000 mL | ORAL_SOLUTION | Freq: Three times a day (TID) | ORAL | 0 refills | Status: DC | PRN

## 2016-11-18 MED ORDER — OSELTAMIVIR PHOSPHATE 75 MG PO CAPS: 75.0000 mg | ORAL_CAPSULE | Freq: Two times a day (BID) | ORAL | 0 refills | Status: DC

## 2016-11-18 NOTE — Assessment & Plan Note (Signed)
Rx for tamiflu as within 48 hours. Rx for hycodan for the cough so she can sleep well. Advised to resume her home zyrtec as she is not taking right now. Call back for worsening or lack of improvement. Explained typical course and duration.

## 2016-11-18 NOTE — Progress Notes (Signed)
   Subjective:    Patient ID: Megan Carrillo, female    DOB: Apr 28, 1971, 46 y.o.   MRN: [id number]  HPI The patient is a 46 YO female coming in for cold and flu symptoms for 1-2 days. Her sister had flu last week and she was around her prior to diagnosis. She is having chills, body aches, fatigue. She is having nasal congestion, cough, drainage, sore throat. Cough is mostly dry. Overall symptoms worsened since onset. No energy. Not sleeping well due to cough.   Review of Systems  Constitutional: Positive for activity change, appetite change, chills and fatigue. Negative for diaphoresis, fever and unexpected weight change.  HENT: Positive for congestion, ear pain, postnasal drip, rhinorrhea and sore throat. Negative for ear discharge, nosebleeds, sinus pain, sinus pressure and trouble swallowing.   Eyes: Negative.   Respiratory: Positive for cough. Negative for chest tightness, shortness of breath and wheezing.   Cardiovascular: Negative.   Gastrointestinal: Negative.   Musculoskeletal: Positive for myalgias.  Neurological: Positive for headaches.      Objective:   Physical Exam  Constitutional: She is oriented to person, place, and time. She appears well-developed and well-nourished.  HENT:  Head: Normocephalic and atraumatic.  Oropharynx with redness and clear drainage, mild frontal tenderness  Eyes: EOM are normal.  Neck: Normal range of motion. No JVD present.  Cardiovascular: Normal rate and regular rhythm.   Pulmonary/Chest: Effort normal and breath sounds normal. No respiratory distress. She has no wheezes. She has no rales.  Abdominal: Soft.  Lymphadenopathy:    She has no cervical adenopathy.  Neurological: She is alert and oriented to person, place, and time.  Skin: Skin is warm and dry.   Vitals:   11/18/16 1341  BP: 130/78  Pulse: 81  Temp: 98.3 F (36.8 C)  TempSrc: Oral  SpO2: 98%  Weight: 198 lb (89.8 kg)  Height: 5\' 4"  (1.626 m)      Assessment & Plan:

## 2016-11-18 NOTE — Progress Notes (Signed)
Pre visit review using our clinic review tool, if applicable. No additional management support is needed unless otherwise documented below in the visit note. 

## 2016-11-18 NOTE — Patient Instructions (Addendum)
We have sent in the tamiflu to start taking today. Take 1 pill twice a day for 5 days.   We have given you some cough medicine today.    Influenza, Adult Influenza, more commonly known as "the flu," is a viral infection that primarily affects the respiratory tract. The respiratory tract includes organs that help you breathe, such as the lungs, nose, and throat. The flu causes many common cold symptoms, as well as a high fever and body aches. The flu spreads easily from person to person (is contagious). Getting a flu shot (influenza vaccination) every year is the best way to prevent influenza. What are the causes? Influenza is caused by a virus. You can catch the virus by:  Breathing in droplets from an infected person's cough or sneeze.  Touching something that was recently contaminated with the virus and then touching your mouth, nose, or eyes. What increases the risk? The following factors may make you more likely to get the flu:  Not cleaning your hands frequently with soap and water or alcohol-based hand sanitizer.  Having close contact with many people during cold and flu season.  Touching your mouth, eyes, or nose without washing or sanitizing your hands first.  Not drinking enough fluids or not eating a healthy diet.  Not getting enough sleep or exercise.  Being under a high amount of stress.  Not getting a yearly (annual) flu shot. You may be at a higher risk of complications from the flu, such as a severe lung infection (pneumonia), if you:  Are over the age of 86.  Are pregnant.  Have a weakened disease-fighting system (immune system). You may have a weakened immune system if you:  Have HIV or AIDS.  Are undergoing chemotherapy.  Aretaking medicines that reduce the activity of (suppress) the immune system.  Have a long-term (chronic) illness, such as heart disease, kidney disease, diabetes, or lung disease.  Have a liver disorder.  Are obese.  Have  anemia. What are the signs or symptoms? Symptoms of this condition typically last 4-10 days and may include:  Fever.  Chills.  Headache, body aches, or muscle aches.  Sore throat.  Cough.  Runny or congested nose.  Chest discomfort and cough.  Poor appetite.  Weakness or tiredness (fatigue).  Dizziness.  Nausea or vomiting. How is this diagnosed? This condition may be diagnosed based on your medical history and a physical exam. Your health care provider may do a nose or throat swab test to confirm the diagnosis. How is this treated? If influenza is detected early, you can be treated with antiviral medicine that can reduce the length of your illness and the severity of your symptoms. This medicine may be given by mouth (orally) or through an IV tube that is inserted in one of your veins. The goal of treatment is to relieve symptoms by taking care of yourself at home. This may include taking over-the-counter medicines, drinking plenty of fluids, and adding humidity to the air in your home. In some cases, influenza goes away on its own. Severe influenza or complications from influenza may be treated in a hospital. Follow these instructions at home:  Take over-the-counter and prescription medicines only as told by your health care provider.  Use a cool mist humidifier to add humidity to the air in your home. This can make breathing easier.  Rest as needed.  Drink enough fluid to keep your urine clear or pale yellow.  Cover your mouth and nose when you  cough or sneeze.  Wash your hands with soap and water often, especially after you cough or sneeze. If soap and water are not available, use hand sanitizer.  Stay home from work or school as told by your health care provider. Unless you are visiting your health care provider, try to avoid leaving home until your fever has been gone for 24 hours without the use of medicine.  Keep all follow-up visits as told by your health care  provider. This is important. How is this prevented?  Getting an annual flu shot is the best way to avoid getting the flu. You may get the flu shot in late summer, fall, or winter. Ask your health care provider when you should get your flu shot.  Wash your hands often or use hand sanitizer often.  Avoid contact with people who are sick during cold and flu season.  Eat a healthy diet, drink plenty of fluids, get enough sleep, and exercise regularly. Contact a health care provider if:  You develop new symptoms.  You have:  Chest pain.  Diarrhea.  A fever.  Your cough gets worse.  You produce more mucus.  You feel nauseous or you vomit. Get help right away if:  You develop shortness of breath or difficulty breathing.  Your skin or nails turn a bluish color.  You have severe pain or stiffness in your neck.  You develop a sudden headache or sudden pain in your face or ear.  You cannot stop vomiting. This information is not intended to replace advice given to you by your health care provider. Make sure you discuss any questions you have with your health care provider. Document Released: 09/05/2000 Document Revised: 02/14/2016 Document Reviewed: 07/03/2015 Elsevier Interactive Patient Education  2017 [employer]

## 2017-04-30 ENCOUNTER — Ambulatory Visit: Payer: Self-pay | Admitting: Family

## 2017-06-20 ENCOUNTER — Other Ambulatory Visit: Payer: Self-pay | Admitting: Gastroenterology

## 2017-06-22 NOTE — Telephone Encounter (Signed)
meds approved  Protonix and zantac

## 2017-06-25 ENCOUNTER — Telehealth: Payer: Self-pay | Admitting: Gastroenterology

## 2017-06-25 NOTE — Telephone Encounter (Signed)
Called pharmacy and patients meds are ready and her copay is $10    Mix up at pharmacy

## 2017-07-22 LAB — HM MAMMOGRAPHY

## 2017-07-23 LAB — HM PAP SMEAR: HM Pap smear: NEGATIVE

## 2017-07-29 ENCOUNTER — Ambulatory Visit (INDEPENDENT_AMBULATORY_CARE_PROVIDER_SITE_OTHER): Payer: Commercial Managed Care - PPO

## 2017-07-29 DIAGNOSIS — Z23 Encounter for immunization: Secondary | ICD-10-CM | POA: Diagnosis not present

## 2017-09-04 ENCOUNTER — Other Ambulatory Visit (HOSPITAL_COMMUNITY): Payer: Self-pay | Admitting: Obstetrics and Gynecology

## 2017-09-04 DIAGNOSIS — N979 Female infertility, unspecified: Secondary | ICD-10-CM

## 2017-09-07 ENCOUNTER — Ambulatory Visit (HOSPITAL_COMMUNITY)
Admission: RE | Admit: 2017-09-07 | Discharge: 2017-09-07 | Disposition: A | Discharge status: Home / Self Care | Payer: Commercial Managed Care - PPO | Source: Ambulatory Visit | Attending: Obstetrics and Gynecology | Admitting: Obstetrics and Gynecology

## 2017-09-07 DIAGNOSIS — N979 Female infertility, unspecified: Secondary | ICD-10-CM | POA: Diagnosis present

## 2017-09-07 MED ORDER — IOPAMIDOL (ISOVUE-300) INJECTION 61%
30.0000 mL | Freq: Once | INTRAVENOUS | Status: AC | PRN
Start: 2017-09-07 — End: 2017-09-07
  Administered 2017-09-07: 30 mL

## 2017-09-10 ENCOUNTER — Other Ambulatory Visit: Payer: Self-pay | Admitting: Gastroenterology

## 2017-12-29 ENCOUNTER — Encounter (HOSPITAL_COMMUNITY): Payer: Self-pay | Admitting: *Deleted

## 2017-12-29 NOTE — H&P (Signed)
Megan Carrillo-Auth is an 47 y.o. female G1P0, with failed IVF pregnancy.  Quants not rising appropriately and gest sac only on ultrasound.  Pt is being co-managed with [facility] [place].  Has had some VB and cramping.  [facility]  D/W pt r/b/a of D&C.    Pertinent Gynecological History:  OB History: G1, P0010 G1 SAB - failed IVF + abn pap, last 2015 WNL HR HPV neg No STD H/o endometriosis   Menstrual History:  No LMP recorded.    Past Medical History:  Diagnosis Date  . Anxiety   . Depression   . Endometriosis   . GERD (gastroesophageal reflux disease)   . Heart murmur    at birth, not detected as adule  . History of kidney stones   . HLD (hyperlipidemia)   . HTN (hypertension)    patient unsure no meds  . Kidney stones   . PONV (postoperative nausea and vomiting)     Past Surgical History:  Procedure Laterality Date  . ANAL FISTULECTOMY  2005  . ENDOMETRIAL ABLATION Bilateral 2001   with ovarian cyst removal  . EYE SURGERY     BMT  . URETHRA SURGERY  2010   with stent placed, for kidney stones  . URETHRA SURGERY     stent removed  egg retreival  Family History  Problem Relation Age of Onset  . Lymphoma Mother        spleen mets to liver and lungs  . Irritable bowel syndrome Mother   . Basal cell carcinoma Father   . Heart disease Father        CABG x 5  . Stroke Father   . Diabetes Father   . Colon polyps Father   . Heart attack Father   . Hyperlipidemia Father   . Hypertension Father   . Colon cancer Maternal Grandmother   . Irritable bowel syndrome Maternal Aunt   . Diabetes Sister   . Hypertension Sister   . Hyperlipidemia Sister   . Polycystic ovary syndrome Sister     Social History:  reports that she has never smoked. She has never used smokeless tobacco. She reports that she does not drink alcohol or use drugs. married, Print production planneroffice manager  Allergies:  Allergies  Allergen Reactions  . Sulfa Antibiotics Other (See Comments)     Patient reports her parents are both highly allergic to sulfa so she has never taken.     No medications prior to admission.  Levothyroxine, protonix, PNV, Vitamin D  Review of Systems  Constitutional: Negative.   HENT: Negative.   Eyes: Negative.   Respiratory: Negative.   Cardiovascular: Negative.   Gastrointestinal: Positive for abdominal pain.  Genitourinary: Negative.   Musculoskeletal: Negative.   Skin: Negative.   Neurological: Negative.   Psychiatric/Behavioral: Negative.     Height 5\' 3"  (1.6 m), weight 81.6 kg (180 lb). Physical Exam  Constitutional: She is oriented to person, place, and time. She appears well-developed and well-nourished.  HENT:  Head: Normocephalic and atraumatic.  Cardiovascular: Normal rate and regular rhythm.  Respiratory: Effort normal and breath sounds normal. No respiratory distress. She has no wheezes.  GI: Soft. Bowel sounds are normal. She exhibits no distension. There is no tenderness.  Musculoskeletal: Normal range of motion.  Neurological: She is alert and oriented to person, place, and time.  Skin: Skin is warm and dry.  Psychiatric: She has a normal mood and affect. Her behavior is normal.  Inappropriately rising quant, was followed  [place] gest sac   Assessment/Plan: 47yo G1P0 with failed pregnancy for D&C doxycycline pre and post op  D/w pt r/b/a of D&C  Gennifer Potenza Bovard-Stuckert 12/29/2017, 2:05 PM

## 2017-12-30 ENCOUNTER — Encounter (HOSPITAL_COMMUNITY)
Admission: RE | Disposition: A | Discharge status: Home / Self Care | Payer: Self-pay | Source: Ambulatory Visit | Attending: Obstetrics and Gynecology

## 2017-12-30 ENCOUNTER — Ambulatory Visit (HOSPITAL_COMMUNITY): Payer: Commercial Managed Care - PPO | Admitting: Anesthesiology

## 2017-12-30 ENCOUNTER — Ambulatory Visit (HOSPITAL_COMMUNITY)
Admission: RE | Admit: 2017-12-30 | Discharge: 2017-12-30 | Disposition: A | Discharge status: Home / Self Care | Payer: Commercial Managed Care - PPO | Source: Ambulatory Visit | Attending: Obstetrics and Gynecology | Admitting: Obstetrics and Gynecology

## 2017-12-30 ENCOUNTER — Other Ambulatory Visit: Payer: Self-pay

## 2017-12-30 ENCOUNTER — Encounter (HOSPITAL_COMMUNITY): Payer: Self-pay

## 2017-12-30 DIAGNOSIS — O021 Missed abortion: Secondary | ICD-10-CM | POA: Diagnosis not present

## 2017-12-30 DIAGNOSIS — E669 Obesity, unspecified: Secondary | ICD-10-CM | POA: Insufficient documentation

## 2017-12-30 DIAGNOSIS — Z79890 Hormone replacement therapy (postmenopausal): Secondary | ICD-10-CM | POA: Insufficient documentation

## 2017-12-30 DIAGNOSIS — Z882 Allergy status to sulfonamides status: Secondary | ICD-10-CM | POA: Diagnosis not present

## 2017-12-30 DIAGNOSIS — Z79899 Other long term (current) drug therapy: Secondary | ICD-10-CM | POA: Insufficient documentation

## 2017-12-30 DIAGNOSIS — Z7982 Long term (current) use of aspirin: Secondary | ICD-10-CM | POA: Diagnosis not present

## 2017-12-30 DIAGNOSIS — E039 Hypothyroidism, unspecified: Secondary | ICD-10-CM | POA: Diagnosis not present

## 2017-12-30 DIAGNOSIS — K219 Gastro-esophageal reflux disease without esophagitis: Secondary | ICD-10-CM | POA: Diagnosis not present

## 2017-12-30 HISTORY — DX: Major depressive disorder, single episode, unspecified: F32.9

## 2017-12-30 HISTORY — DX: Personal history of urinary calculi: Z87.442

## 2017-12-30 HISTORY — DX: Depression, unspecified: F32.A

## 2017-12-30 HISTORY — DX: Nausea with vomiting, unspecified: R11.2

## 2017-12-30 HISTORY — PX: DILATION AND EVACUATION: SHX1459

## 2017-12-30 HISTORY — DX: Missed abortion: O02.1

## 2017-12-30 HISTORY — DX: Other specified postprocedural states: Z98.890

## 2017-12-30 LAB — CBC
HEMATOCRIT: 40.2 % (ref 36.0–46.0)
Hemoglobin: 14.1 g/dL (ref 12.0–15.0)
MCH: 30.7 pg (ref 26.0–34.0)
MCHC: 35.1 g/dL (ref 30.0–36.0)
MCV: 87.6 fL (ref 78.0–100.0)
Platelets: 281 10*3/uL (ref 150–400)
RBC: 4.59 MIL/uL (ref 3.87–5.11)
RDW: 13 % (ref 11.5–15.5)
WBC: 8.8 10*3/uL (ref 4.0–10.5)

## 2017-12-30 LAB — ABO/RH: ABO/RH(D): AB POS

## 2017-12-30 SURGERY — DILATION AND EVACUATION, UTERUS
Anesthesia: Monitor Anesthesia Care | Site: Vagina

## 2017-12-30 MED ORDER — LIDOCAINE HCL 2 % IJ SOLN
INTRAMUSCULAR | Status: DC | PRN
  Administered 2017-12-30: 15 mL

## 2017-12-30 MED ORDER — ONDANSETRON HCL 4 MG/2ML IJ SOLN
INTRAMUSCULAR | Status: DC | PRN
  Administered 2017-12-30: 4 mg via INTRAVENOUS

## 2017-12-30 MED ORDER — LACTATED RINGERS IV SOLN: INTRAVENOUS | Status: DC

## 2017-12-30 MED ORDER — DOXYCYCLINE HYCLATE 100 MG PO TABS
ORAL_TABLET | ORAL | Status: AC
  Filled 2017-12-30: qty 2

## 2017-12-30 MED ORDER — LACTATED RINGERS IV SOLN
INTRAVENOUS | Status: DC
  Administered 2017-12-30: 07:00:00 via INTRAVENOUS

## 2017-12-30 MED ORDER — DOXYCYCLINE HYCLATE 100 MG PO TABS: 100.0000 mg | ORAL_TABLET | Freq: Two times a day (BID) | ORAL | Status: DC

## 2017-12-30 MED ORDER — OXYCODONE HCL 5 MG PO TABS: 5.0000 mg | ORAL_TABLET | Freq: Four times a day (QID) | ORAL | 0 refills | Status: DC | PRN

## 2017-12-30 MED ORDER — LIDOCAINE HCL 2 % IJ SOLN
INTRAMUSCULAR | Status: AC
  Filled 2017-12-30: qty 20

## 2017-12-30 MED ORDER — DEXAMETHASONE SODIUM PHOSPHATE 10 MG/ML IJ SOLN
INTRAMUSCULAR | Status: DC | PRN
  Administered 2017-12-30: 8 mg via INTRAVENOUS

## 2017-12-30 MED ORDER — LIDOCAINE HCL 1 % IJ SOLN
INTRAMUSCULAR | Status: AC
  Filled 2017-12-30: qty 20

## 2017-12-30 MED ORDER — FENTANYL CITRATE (PF) 100 MCG/2ML IJ SOLN
INTRAMUSCULAR | Status: DC | PRN
  Administered 2017-12-30 (×2): 50 ug via INTRAVENOUS

## 2017-12-30 MED ORDER — SCOPOLAMINE 1 MG/3DAYS TD PT72
1.0000 | MEDICATED_PATCH | Freq: Once | TRANSDERMAL | Status: DC
  Administered 2017-12-30: 1.5 mg via TRANSDERMAL

## 2017-12-30 MED ORDER — MEPERIDINE HCL 25 MG/ML IJ SOLN: 6.2500 mg | INTRAMUSCULAR | Status: DC | PRN

## 2017-12-30 MED ORDER — DEXAMETHASONE SODIUM PHOSPHATE 10 MG/ML IJ SOLN
INTRAMUSCULAR | Status: AC
  Filled 2017-12-30: qty 1

## 2017-12-30 MED ORDER — HYDROMORPHONE HCL 1 MG/ML IJ SOLN: 0.2500 mg | INTRAMUSCULAR | Status: DC | PRN

## 2017-12-30 MED ORDER — SCOPOLAMINE 1 MG/3DAYS TD PT72
MEDICATED_PATCH | TRANSDERMAL | Status: AC
  Administered 2017-12-30: 1.5 mg via TRANSDERMAL
  Filled 2017-12-30: qty 1

## 2017-12-30 MED ORDER — PROMETHAZINE HCL 25 MG/ML IJ SOLN: 6.2500 mg | INTRAMUSCULAR | Status: DC | PRN

## 2017-12-30 MED ORDER — KETOROLAC TROMETHAMINE 30 MG/ML IJ SOLN
INTRAMUSCULAR | Status: AC
  Filled 2017-12-30: qty 1

## 2017-12-30 MED ORDER — LIDOCAINE HCL (CARDIAC) 20 MG/ML IV SOLN
INTRAVENOUS | Status: AC
  Filled 2017-12-30: qty 5

## 2017-12-30 MED ORDER — LIDOCAINE HCL (CARDIAC) 20 MG/ML IV SOLN
INTRAVENOUS | Status: DC | PRN
  Administered 2017-12-30: 80 mg via INTRAVENOUS

## 2017-12-30 MED ORDER — MIDAZOLAM HCL 2 MG/2ML IJ SOLN
INTRAMUSCULAR | Status: DC | PRN
  Administered 2017-12-30: 2 mg via INTRAVENOUS

## 2017-12-30 MED ORDER — ONDANSETRON HCL 4 MG/2ML IJ SOLN
INTRAMUSCULAR | Status: AC
  Filled 2017-12-30: qty 2

## 2017-12-30 MED ORDER — SODIUM CHLORIDE 0.9 % IV SOLN
100.0000 mg | Freq: Two times a day (BID) | INTRAVENOUS | Status: AC
  Administered 2017-12-30 (×2): 100 mg via INTRAVENOUS
  Filled 2017-12-30 (×2): qty 100

## 2017-12-30 MED ORDER — DOXYCYCLINE HYCLATE 100 MG PO TABS
200.0000 mg | ORAL_TABLET | Freq: Once | ORAL | Status: AC
  Administered 2017-12-30: 200 mg via ORAL

## 2017-12-30 MED ORDER — PROPOFOL 10 MG/ML IV BOLUS
INTRAVENOUS | Status: DC | PRN
  Administered 2017-12-30 (×3): 20 mg via INTRAVENOUS

## 2017-12-30 MED ORDER — PROPOFOL 500 MG/50ML IV EMUL
INTRAVENOUS | Status: DC | PRN
  Administered 2017-12-30: 75 ug/kg/min via INTRAVENOUS

## 2017-12-30 MED ORDER — PROPOFOL 10 MG/ML IV BOLUS
INTRAVENOUS | Status: AC
  Filled 2017-12-30: qty 40

## 2017-12-30 MED ORDER — KETOROLAC TROMETHAMINE 30 MG/ML IJ SOLN
INTRAMUSCULAR | Status: DC | PRN
  Administered 2017-12-30: 30 mg via INTRAVENOUS

## 2017-12-30 MED ORDER — SODIUM CHLORIDE 0.9 % IV SOLN: 100.0000 mg | Freq: Two times a day (BID) | INTRAVENOUS | Status: DC

## 2017-12-30 MED ORDER — MIDAZOLAM HCL 2 MG/2ML IJ SOLN
INTRAMUSCULAR | Status: AC
  Filled 2017-12-30: qty 2

## 2017-12-30 MED ORDER — IBUPROFEN 600 MG PO TABS: 600.0000 mg | ORAL_TABLET | Freq: Four times a day (QID) | ORAL | 1 refills | Status: DC | PRN

## 2017-12-30 MED ORDER — FENTANYL CITRATE (PF) 100 MCG/2ML IJ SOLN
INTRAMUSCULAR | Status: AC
  Filled 2017-12-30: qty 2

## 2017-12-30 SURGICAL SUPPLY — 18 items
CATH ROBINSON RED A/P 16FR (CATHETERS) ×3 IMPLANT
DECANTER SPIKE VIAL GLASS SM (MISCELLANEOUS) ×3 IMPLANT
GLOVE BIO SURGEON STRL SZ 6.5 (GLOVE) ×2 IMPLANT
GLOVE BIO SURGEONS STRL SZ 6.5 (GLOVE) ×1
GLOVE BIOGEL PI IND STRL 7.0 (GLOVE) ×1 IMPLANT
GLOVE BIOGEL PI INDICATOR 7.0 (GLOVE) ×2
GOWN STRL REUS W/TWL LRG LVL3 (GOWN DISPOSABLE) ×6 IMPLANT
KIT BERKELEY 1ST TRIMESTER 3/8 (MISCELLANEOUS) ×3 IMPLANT
NS IRRIG 1000ML POUR BTL (IV SOLUTION) ×3 IMPLANT
PACK VAGINAL MINOR WOMEN LF (CUSTOM PROCEDURE TRAY) ×3 IMPLANT
PAD OB MATERNITY 4.3X12.25 (PERSONAL CARE ITEMS) ×3 IMPLANT
PAD PREP 24X48 CUFFED NSTRL (MISCELLANEOUS) ×3 IMPLANT
SET BERKELEY SUCTION TUBING (SUCTIONS) ×3 IMPLANT
TOWEL OR 17X24 6PK STRL BLUE (TOWEL DISPOSABLE) ×6 IMPLANT
VACURETTE 10 RIGID CVD (CANNULA) IMPLANT
VACURETTE 7MM CVD STRL WRAP (CANNULA) IMPLANT
VACURETTE 8 RIGID CVD (CANNULA) IMPLANT
VACURETTE 9 RIGID CVD (CANNULA) IMPLANT

## 2017-12-30 NOTE — Anesthesia Preprocedure Evaluation (Addendum)
Anesthesia Evaluation  Patient identified by MRN, date of birth, ID band Patient awake    Reviewed: Allergy & Precautions, NPO status , Patient's Chart, lab work & pertinent test results  History of Anesthesia Complications (+) PONV and history of anesthetic complications  Airway Mallampati: I  TM Distance: >3 FB Neck ROM: Full    Dental  (+) Teeth Intact, Dental Advisory Given   Pulmonary neg pulmonary ROS,    breath sounds clear to auscultation       Cardiovascular hypertension,  Rhythm:Regular Rate:Normal     Neuro/Psych PSYCHIATRIC DISORDERS Anxiety Depression negative neurological ROS     GI/Hepatic Neg liver ROS, GERD  Medicated and Controlled,  Endo/Other  Hypothyroidism   Renal/GU      Musculoskeletal negative musculoskeletal ROS (+)   Abdominal (+) + obese,   Peds  Hematology negative hematology ROS (+)   Anesthesia Other Findings - HLD  Reproductive/Obstetrics                            Anesthesia Physical Anesthesia Plan  ASA: II  Anesthesia Plan: MAC   Post-op Pain Management:    Induction: Intravenous  PONV Risk Score and Plan: 3 and Ondansetron, Propofol infusion and Midazolam  Airway Management Planned: Simple Face Mask  Additional Equipment: None  Intra-op Plan:   Post-operative Plan:   Informed Consent: I have reviewed the patients History and Physical, chart, labs and discussed the procedure including the risks, benefits and alternatives for the proposed anesthesia with the patient or authorized representative who has indicated his/her understanding and acceptance.   Dental advisory given  Plan Discussed with: CRNA  Anesthesia Plan Comments:        Anesthesia Quick Evaluation

## 2017-12-30 NOTE — Discharge Instructions (Signed)

## 2017-12-30 NOTE — Brief Op Note (Signed)
12/30/2017  10:00 AM  PATIENT:  Megan Carrillo  47 y.o. female  PRE-OPERATIVE DIAGNOSIS:  MAB  POST-OPERATIVE DIAGNOSIS:  missed abortion  PROCEDURE:  Procedure(s): DILATATION AND EVACUATION (N/A)  SURGEON:  Surgeon(s) and Role:    * Bovard-Stuckert, Shireen Rayburn, MD - Primary  ANESTHESIA:   MAC  EBL:  30 mL   BLOOD ADMINISTERED:none  DRAINS: none   LOCAL MEDICATIONS USED:  LIDOCAINE   SPECIMEN:  Source of Specimen:  Products of Conception  DISPOSITION OF SPECIMEN:  PATHOLOGY  COUNTS:  YES  TOURNIQUET:  * No tourniquets in log *  DICTATION: .Other Dictation: Dictation Number [id number]  PLAN OF CARE: Discharge to home after PACU  PATIENT DISPOSITION:  PACU - hemodynamically stable.   Delay start of Pharmacological VTE agent (>24hrs) due to surgical blood loss or risk of bleeding: not applicable

## 2017-12-30 NOTE — Anesthesia Postprocedure Evaluation (Signed)
Anesthesia Post Note  Patient: Megan Carrillo  Procedure(s) Performed: DILATATION AND EVACUATION (N/A Vagina )     Patient location during evaluation: PACU Anesthesia Type: MAC Level of consciousness: awake and alert Pain management: pain level controlled Vital Signs Assessment: post-procedure vital signs reviewed and stable Respiratory status: spontaneous breathing, nonlabored ventilation, respiratory function stable and patient connected to nasal cannula oxygen Cardiovascular status: stable and blood pressure returned to baseline Postop Assessment: no apparent nausea or vomiting Anesthetic complications: no    Last Vitals:  Vitals:   12/30/17 1015 12/30/17 1022  BP: 119/74   Pulse: 81 83  Resp: 16 15  Temp:  36.7 C  SpO2: 98% 98%    Last Pain:  Vitals:   12/30/17 1022  TempSrc: Oral  PainSc:    Pain Goal: Patients Stated Pain Goal: 3 (12/30/17 1657)               Effie Berkshire

## 2017-12-30 NOTE — Op Note (Signed)
NAME:  Megan Carrillo, Megan Carrillo             ACCOUNT NO.:  [id number]  MEDICAL RECORD NO.:  [id number]  LOCATION:                                 FACILITY:  PHYSICIAN:  Thornell Sartorius, MD             DATE OF BIRTH:  DATE OF PROCEDURE:  12/30/2017 DATE OF DISCHARGE:                              OPERATIVE REPORT   PREOPERATIVE DIAGNOSIS:  Missed abortion.  POSTOPERATIVE DIAGNOSIS:  Missed abortion.  PROCEDURE:  Suction dilation and evacuation.  SURGEON:  Thornell Sartorius, MD.  ANESTHESIA:  MAC.  ESTIMATED BLOOD LOSS:  30 cc.  IV FLUIDS:  Per anesthesia.  URINE OUTPUT:  About 50 cc by in and out cath prior to the procedure.  Prior to the procedure in the PACU, discussed with the patient's risks, benefits, and alternatives of the surgical procedure.  She had failed IVF pregnancy and wants to proceed with the D and C.  A quick bedside ultrasound was performed revealing a remaining sac in her uterus.  DESCRIPTION OF PROCEDURE:  She was transported to the OR, placed on the table in supine position, then placed in the [place]ell[place]ins stirrups, prepped and draped in the normal sterile fashion.  Her bladder was sterilely drained.  Her uterus was palpated prior to the procedure and found to be mid position, sounded to approximately 8 cm, dilated to accommodate a 7- French suction curette.  This was placed with 3 or 4 passes of the uterus.  Products were removed.  These will be sent to path.  Bleeding was well controlled.  The patient was stable to PACU following procedure.    Thornell Sartorius, MD   ______________________________ Thornell Sartorius, MD   JB/MEDQ  D:  12/30/2017  T:  12/30/2017  Job:  [id number]

## 2017-12-30 NOTE — Transfer of Care (Signed)
Immediate Anesthesia Transfer of Care Note  Patient: Megan Carrillo  Procedure(s) Performed: DILATATION AND EVACUATION (N/A Vagina )  Patient Location: PACU  Anesthesia Type:MAC  Level of Consciousness: awake and patient cooperative  Airway & Oxygen Therapy: Patient Spontanous Breathing  Post-op Assessment: Report given to RN and Post -op Vital signs reviewed and stable  Post vital signs: Reviewed and stable  Last Vitals:  Vitals Value Taken Time  BP    Temp    Pulse 83 12/30/2017  9:25 AM  Resp    SpO2 99 % 12/30/2017  9:25 AM  Vitals shown include unvalidated device data.  Last Pain:  Vitals:   12/30/17 0643  TempSrc: Oral      Patients Stated Pain Goal: 3 (58/52/77 8242)  Complications: No apparent anesthesia complications

## 2017-12-30 NOTE — Interval H&P Note (Signed)
History and Physical Interval Note:  12/30/2017 7:53 AM  Gwenlyn FoundKaren D Witt-Auth  has presented today for surgery, with the diagnosis of MAB  The various methods of treatment have been discussed with the patient and family. After consideration of risks, benefits and other options for treatment, the patient has consented to  Procedure(s): DILATATION AND EVACUATION (N/A) as a surgical intervention .  The patient's history has been reviewed, patient examined, no change in status, stable for surgery.  I have reviewed the patient's chart and labs.  Questions were answered to the patient's satisfaction.     Kwaku Mostafa Bovard-Stuckert

## 2017-12-31 ENCOUNTER — Encounter (HOSPITAL_COMMUNITY): Payer: Self-pay | Admitting: Obstetrics and Gynecology

## 2018-06-11 ENCOUNTER — Encounter: Payer: Self-pay | Admitting: Internal Medicine

## 2018-06-14 NOTE — Patient Instructions (Addendum)
All other Health Maintenance issues reviewed.   All recommended immunizations and age-appropriate screenings are up-to-date or discussed.  Flu immunization administered today.    Medications reviewed and updated.  Changes include :   none     Health Maintenance, Female Adopting a healthy lifestyle and getting preventive care can go a long way to promote health and wellness. Talk with your health care provider about what schedule of regular examinations is right for you. This is a good chance for you to check in with your provider about disease prevention and staying healthy. In between checkups, there are plenty of things you can do on your own. Experts have done a lot of research about which lifestyle changes and preventive measures are most likely to keep you healthy. Ask your health care provider for more information. Weight and diet Eat a healthy diet  Be sure to include plenty of vegetables, fruits, low-fat dairy products, and lean protein.  Do not eat a lot of foods high in solid fats, added sugars, or salt.  Get regular exercise. This is one of the most important things you can do for your health. ? Most adults should exercise for at least 150 minutes each week. The exercise should increase your heart rate and make you sweat (moderate-intensity exercise). ? Most adults should also do strengthening exercises at least twice a week. This is in addition to the moderate-intensity exercise.  Maintain a healthy weight  Body mass index (BMI) is a measurement that can be used to identify possible weight problems. It estimates body fat based on height and weight. Your health care provider can help determine your BMI and help you achieve or maintain a healthy weight.  For females 41 years of age and older: ? A BMI below 18.5 is considered underweight. ? A BMI of 18.5 to 24.9 is normal. ? A BMI of 25 to 29.9 is considered overweight. ? A BMI of 30 and above is considered obese.  Watch  levels of cholesterol and blood lipids  You should start having your blood tested for lipids and cholesterol at 45 years of age, then have this test every 5 years.  You may need to have your cholesterol levels checked more often if: ? Your lipid or cholesterol levels are high. ? You are older than 45 years of age. ? You are at high risk for heart disease.  Cancer screening Lung Cancer  Lung cancer screening is recommended for adults 67-9 years old who are at high risk for lung cancer because of a history of smoking.  A yearly low-dose CT scan of the lungs is recommended for people who: ? Currently smoke. ? Have quit within the past 15 years. ? Have at least a 30-pack-year history of smoking. A pack year is smoking an average of one pack of cigarettes a day for 1 year.  Yearly screening should continue until it has been 15 years since you quit.  Yearly screening should stop if you develop a health problem that would prevent you from having lung cancer treatment.  Breast Cancer  Practice breast self-awareness. This means understanding how your breasts normally appear and feel.  It also means doing regular breast self-exams. Let your health care provider know about any changes, no matter how small.  If you are in your 20s or 30s, you should have a clinical breast exam (CBE) by a health care provider every 1-3 years as part of a regular health exam.  If you are 40  a CBE every year. Also consider having a breast X-ray (mammogram) every year.  If you have a family history of breast cancer, talk to your health care provider about genetic screening.  If you are at high risk for breast cancer, talk to your health care provider about having an MRI and a mammogram every year.  Breast cancer gene (BRCA) assessment is recommended for women who have family members with BRCA-related cancers. BRCA-related cancers include: ? Breast. ? Ovarian. ? Tubal. ? Peritoneal cancers.  Results  of the assessment will determine the need for genetic counseling and BRCA1 and BRCA2 testing.  Cervical Cancer Your health care provider may recommend that you be screened regularly for cancer of the pelvic organs (ovaries, uterus, and vagina). This screening involves a pelvic examination, including checking for microscopic changes to the surface of your cervix (Pap test). You may be encouraged to have this screening done every 3 years, beginning at age 21.  For women ages 30-65, health care providers may recommend pelvic exams and Pap testing every 3 years, or they may recommend the Pap and pelvic exam, combined with testing for human papilloma virus (HPV), every 5 years. Some types of HPV increase your risk of cervical cancer. Testing for HPV may also be done on women of any age with unclear Pap test results.  Other health care providers may not recommend any screening for nonpregnant women who are considered low risk for pelvic cancer and who do not have symptoms. Ask your health care provider if a screening pelvic exam is right for you.  If you have had past treatment for cervical cancer or a condition that could lead to cancer, you need Pap tests and screening for cancer for at least 20 years after your treatment. If Pap tests have been discontinued, your risk factors (such as having a new sexual partner) need to be reassessed to determine if screening should resume. Some women have medical problems that increase the chance of getting cervical cancer. In these cases, your health care provider may recommend more frequent screening and Pap tests.  Colorectal Cancer  This type of cancer can be detected and often prevented.  Routine colorectal cancer screening usually begins at 45 years of age and continues through 45 years of age.  Your health care provider may recommend screening at an earlier age if you have risk factors for colon cancer.  Your health care provider may also recommend using  home test kits to check for hidden blood in the stool.  A small camera at the end of a tube can be used to examine your colon directly (sigmoidoscopy or colonoscopy). This is done to check for the earliest forms of colorectal cancer.  Routine screening usually begins at age 50.  Direct examination of the colon should be repeated every 5-10 years through 45 years of age. However, you may need to be screened more often if early forms of precancerous polyps or small growths are found.  Skin Cancer  Check your skin from head to toe regularly.  Tell your health care provider about any new moles or changes in moles, especially if there is a change in a mole's shape or color.  Also tell your health care provider if you have a mole that is larger than the size of a pencil eraser.  Always use sunscreen. Apply sunscreen liberally and repeatedly throughout the day.  Protect yourself by wearing long sleeves, pants, a wide-brimmed hat, and sunglasses whenever you are outside.  Heart   disease, diabetes, and high blood pressure  High blood pressure causes heart disease and increases the risk of stroke. High blood pressure is more likely to develop in: ? People who have blood pressure in the high end of the normal range (130-139/85-89 mm Hg). ? People who are overweight or obese. ? People who are African American.  If you are 18-39 years of age, have your blood pressure checked every 3-5 years. If you are 40 years of age or older, have your blood pressure checked every year. You should have your blood pressure measured twice-once when you are at a hospital or clinic, and once when you are not at a hospital or clinic. Record the average of the two measurements. To check your blood pressure when you are not at a hospital or clinic, you can use: ? An automated blood pressure machine at a pharmacy. ? A home blood pressure monitor.  If you are between 55 years and 79 years old, ask your health care provider  if you should take aspirin to prevent strokes.  Have regular diabetes screenings. This involves taking a blood sample to check your fasting blood sugar level. ? If you are at a normal weight and have a low risk for diabetes, have this test once every three years after 45 years of age. ? If you are overweight and have a high risk for diabetes, consider being tested at a younger age or more often. Preventing infection Hepatitis B  If you have a higher risk for hepatitis B, you should be screened for this virus. You are considered at high risk for hepatitis B if: ? You were born in a country where hepatitis B is common. Ask your health care provider which countries are considered high risk. ? Your parents were born in a high-risk country, and you have not been immunized against hepatitis B (hepatitis B vaccine). ? You have HIV or AIDS. ? You use needles to inject street drugs. ? You live with someone who has hepatitis B. ? You have had sex with someone who has hepatitis B. ? You get hemodialysis treatment. ? You take certain medicines for conditions, including cancer, organ transplantation, and autoimmune conditions.  Hepatitis C  Blood testing is recommended for: ? Everyone born from 1945 through 1965. ? Anyone with known risk factors for hepatitis C.  Sexually transmitted infections (STIs)  You should be screened for sexually transmitted infections (STIs) including gonorrhea and chlamydia if: ? You are sexually active and are younger than 45 years of age. ? You are older than 45 years of age and your health care provider tells you that you are at risk for this type of infection. ? Your sexual activity has changed since you were last screened and you are at an increased risk for chlamydia or gonorrhea. Ask your health care provider if you are at risk.  If you do not have HIV, but are at risk, it may be recommended that you take a prescription medicine daily to prevent HIV infection. This  is called pre-exposure prophylaxis (PrEP). You are considered at risk if: ? You are sexually active and do not regularly use condoms or know the HIV status of your partner(s). ? You take drugs by injection. ? You are sexually active with a partner who has HIV.  Talk with your health care provider about whether you are at high risk of being infected with HIV. If you choose to begin PrEP, you should first be tested for HIV. You   for HIV. You should then be tested every 3 months for as long as you are taking PrEP. Pregnancy  If you are premenopausal and you may become pregnant, ask your health care provider about preconception counseling.  If you may become pregnant, take 400 to 800 micrograms (mcg) of folic acid every day.  If you want to prevent pregnancy, talk to your health care provider about birth control (contraception). Osteoporosis and menopause  Osteoporosis is a disease in which the bones lose minerals and strength with aging. This can result in serious bone fractures. Your risk for osteoporosis can be identified using a bone density scan.  If you are 21 years of age or older, or if you are at risk for osteoporosis and fractures, ask your health care provider if you should be screened.  Ask your health care provider whether you should take a calcium or vitamin D supplement to lower your risk for osteoporosis.  Menopause may have certain physical symptoms and risks.  Hormone replacement therapy may reduce some of these symptoms and risks. Talk to your health care provider about whether hormone replacement therapy is right for you. Follow these instructions at home:  Schedule regular health, dental, and eye exams.  Stay current with your immunizations.  Do not use any tobacco products including cigarettes, chewing tobacco, or electronic cigarettes.  If you are pregnant, do not drink alcohol.  If you are breastfeeding, limit how much and how often you drink  alcohol.  Limit alcohol intake to no more than 1 drink per day for nonpregnant women. One drink equals 12 ounces of beer, 5 ounces of wine, or 1 ounces of hard liquor.  Do not use street drugs.  Do not share needles.  Ask your health care provider for help if you need support or information about quitting drugs.  Tell your health care provider if you often feel depressed.  Tell your health care provider if you have ever been abused or do not feel safe at home. This information is not intended to replace advice given to you by your health care provider. Make sure you discuss any questions you have with your health care provider. Document Released: 03/24/2011 Document Revised: 02/14/2016 Document Reviewed: 06/12/2015 Elsevier Interactive Patient Education  [employer].

## 2018-06-14 NOTE — Progress Notes (Signed)
Subjective:    Patient ID: Megan Carrillo, female    DOB: [birth year], 47 y.o.   MRN: [id number]  HPI She is here for a physical exam.   She is going through IVF and had a miscarriage earlier this year and had an embryo that did not take.  She does have increased anxiety and depression and feels like she is on an emotional roller coaster.  She thinks she is doing okay right now.  She does want to try to get healthier and would like to lose some weight.  She is walking irregularly-she walks her dog.  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  She has no major concerns.  Medications and allergies reviewed with patient and updated if appropriate.  Patient Active Problem List   Diagnosis Date Noted  . Missed abortion 12/30/2017  . GERD (gastroesophageal reflux disease) 08/22/2016  . History of nephrolithiasis 08/22/2016  . Vitamin D deficiency 08/22/2016    Current Outpatient Medications on File Prior to Visit  Medication Sig Dispense Refill  . acetaminophen (TYLENOL) 500 MG tablet Take 1,000 mg by mouth every 6 (six) hours as needed for moderate pain or headache.    . ergocalciferol (VITAMIN D2) 50000 units capsule Take 50,000 Units by mouth every Wednesday.    . pantoprazole (PROTONIX) 40 MG tablet take 1 tablet by mouth at bedtime (Patient taking differently: Take 40 mg by mouth at bedtime) 30 tablet 11  . Prenatal Vit-Fe Fumarate-FA (PRENATAL VITAMIN PO) Take 1 tablet by mouth daily.     No current facility-administered medications on file prior to visit.     Past Medical History:  Diagnosis Date  . Anxiety   . Depression   . Endometriosis   . GERD (gastroesophageal reflux disease)   . Heart murmur    at birth, not detected as adule  . History of kidney stones   . HLD (hyperlipidemia)   . HTN (hypertension)    patient unsure no meds  . Kidney stones   . Missed abortion 12/30/2017  . PONV (postoperative nausea and vomiting)     Past Surgical  History:  Procedure Laterality Date  . ANAL FISTULECTOMY  2005  . DILATION AND EVACUATION N/A 12/30/2017   Procedure: DILATATION AND EVACUATION;  Surgeon: Sherian Rein, MD;  Location: [facility];  Service: Gynecology;  Laterality: N/A;  . ENDOMETRIAL ABLATION Bilateral 2001   with ovarian cyst removal  . EYE SURGERY     BMT  . URETHRA SURGERY  2010   with stent placed, for kidney stones  . URETHRA SURGERY     stent removed    Social History   Socioeconomic History  . Marital status: Married    Spouse name: Not on file  . Number of children: 3  . Years of education: Not on file  . Highest education level: Not on file  Occupational History  . Occupation: receptionist    Comment: Transport planner  Social Needs  . Financial resource strain: Not on file  . Food insecurity:    Worry: Not on file    Inability: Not on file  . Transportation needs:    Medical: Not on file    Non-medical: Not on file  Tobacco Use  . Smoking status: Never Smoker  . Smokeless tobacco: Never Used  Substance and Sexual Activity  . Alcohol use: No  . Drug use: No  . Sexual activity: Yes    Birth control/protection: Pill  Lifestyle  .  Physical activity:    Days per week: Not on file    Minutes per session: Not on file  . Stress: Not on file  Relationships  . Social connections:    Talks on phone: Not on file    Gets together: Not on file    Attends religious service: Not on file    Active member of club or organization: Not on file    Attends meetings of clubs or organizations: Not on file    Relationship status: Not on file  Other Topics Concern  . 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 Lymphoma Mother        spleen mets to liver and lungs  . Irritable bowel syndrome Mother   . Basal cell carcinoma Father   . Heart disease Father         CABG x 5  . Stroke Father   . Diabetes Father   . Colon polyps Father   . Heart attack Father   . Hyperlipidemia Father   . Hypertension Father   . Colon cancer Maternal Grandmother   . Irritable bowel syndrome Maternal Aunt   . Diabetes Sister   . Hypertension Sister   . Hyperlipidemia Sister   . Polycystic ovary syndrome Sister     Review of Systems  Constitutional: Negative for chills and fever.  Eyes: Negative for visual disturbance.  Respiratory: Negative for cough, shortness of breath and wheezing.   Cardiovascular: Positive for chest pain (occ with stress). Negative for palpitations and leg swelling.  Gastrointestinal: Negative for abdominal pain, blood in stool, constipation, diarrhea and nausea.  Genitourinary: Negative for dysuria and hematuria.  Musculoskeletal: Positive for back pain (mild, intermittent). Negative for arthralgias.  Skin: Negative for color change and rash.  Neurological: Positive for light-headedness (occasional). Negative for dizziness and headaches.  Psychiatric/Behavioral: Negative for dysphoric mood. The patient is not nervous/anxious.        Objective:   Vitals:   06/15/18 1450  BP: 124/78  Pulse: 83  Resp: 16  Temp: 98.6 F (37 C)  SpO2: 98%   Filed Weights   06/15/18 1450  Weight: 189 lb (85.7 kg)   Body mass index is 33.48 kg/m.  Wt Readings from Last 3 Encounters:  06/15/18 189 lb (85.7 kg)  12/29/17 180 lb (81.6 kg)  11/18/16 198 lb (89.8 kg)     Physical Exam Constitutional: She appears well-developed and well-nourished. No distress.  HENT:  Head: Normocephalic and atraumatic.  Right Ear: External ear normal. Normal ear canal and TM Left Ear: External ear normal.  Normal ear canal and TM Mouth/Throat: Oropharynx is clear and moist.  Eyes: Conjunctivae and EOM are normal.  Neck: Neck supple. No tracheal deviation present. No thyromegaly present.  No carotid bruit  Cardiovascular: Normal rate, regular rhythm  and normal heart sounds.   No murmur heard.  No edema. Pulmonary/Chest: Effort normal and breath sounds normal. No respiratory distress. She has no wheezes. She has no rales.  Breast: deferred to Gyn Abdominal: Soft. She exhibits no distension. There is no tenderness.  Lymphadenopathy: She has no cervical adenopathy.  Skin: Skin is warm and dry. She is not diaphoretic.  Psychiatric: She has a normal mood and affect. Her behavior is normal.        Assessment &  Plan:   Physical exam: Screening blood work  Deferred - had it done by fertility clinic Immunizations   Flu vaccine today, td up to date Mammogram  Up to date  Gyn   Up to date  - [facility] exams     Up to date  Exercise  Some walking Weight discussed weight loss-she would like to lose weight-encouraged decrease portions and increasing walking Skin   no concerns Substance abuse   none  See Problem List for Assessment and Plan of chronic medical problems.

## 2018-06-15 ENCOUNTER — Encounter: Payer: Self-pay | Admitting: Internal Medicine

## 2018-06-15 ENCOUNTER — Ambulatory Visit: Payer: BC Managed Care – PPO | Admitting: Internal Medicine

## 2018-06-15 VITALS — BP 124/78 | HR 83 | Temp 98.6°F | Resp 16 | Ht 63.0 in | Wt 189.0 lb

## 2018-06-15 DIAGNOSIS — F32A Depression, unspecified: Secondary | ICD-10-CM | POA: Insufficient documentation

## 2018-06-15 DIAGNOSIS — K219 Gastro-esophageal reflux disease without esophagitis: Secondary | ICD-10-CM | POA: Diagnosis not present

## 2018-06-15 DIAGNOSIS — F329 Major depressive disorder, single episode, unspecified: Secondary | ICD-10-CM

## 2018-06-15 DIAGNOSIS — Z23 Encounter for immunization: Secondary | ICD-10-CM | POA: Diagnosis not present

## 2018-06-15 DIAGNOSIS — F419 Anxiety disorder, unspecified: Secondary | ICD-10-CM

## 2018-06-15 DIAGNOSIS — Z0000 Encounter for general adult medical examination without abnormal findings: Secondary | ICD-10-CM | POA: Diagnosis not present

## 2018-06-15 NOTE — Assessment & Plan Note (Signed)
She is experiencing some anxiety and depression related to IVF treatment, miscarriage and all the emotions that go with the treatment She does not feel that she needs medication, but will let me know if she does in the near future Recommended regular exercise-start walking regularly If she does any medication advised her to discuss with her fertility doctor first and I can prescribe

## 2018-06-15 NOTE — Assessment & Plan Note (Signed)
GERD controlled Continue daily medication  

## 2018-06-21 ENCOUNTER — Ambulatory Visit: Payer: BC Managed Care – PPO | Admitting: Family

## 2018-06-21 ENCOUNTER — Encounter: Payer: Self-pay | Admitting: Family

## 2018-06-21 VITALS — BP 128/78 | HR 107 | Temp 97.9°F | Ht 63.0 in | Wt 191.4 lb

## 2018-06-21 DIAGNOSIS — L309 Dermatitis, unspecified: Secondary | ICD-10-CM | POA: Diagnosis not present

## 2018-06-21 MED ORDER — METHYLPREDNISOLONE 4 MG PO TBPK
ORAL_TABLET | ORAL | 0 refills | Status: DC
Start: 2018-06-21 — End: 2019-03-24

## 2018-06-21 NOTE — Progress Notes (Signed)
Megan Carrillo is a 47 y.o. female with the following history as recorded in [employer]:  Patient Active Problem List   Diagnosis Date Noted  . Anxiety and depression 06/15/2018  . Missed abortion 12/30/2017  . GERD (gastroesophageal reflux disease) 08/22/2016  . History of nephrolithiasis 08/22/2016  . Vitamin D deficiency 08/22/2016    Current Outpatient Medications  Medication Sig Dispense Refill  . Prenatal Vit-Fe Fumarate-FA (PRENATAL VITAMIN PO) Take 1 tablet by mouth daily.    . ranitidine (ZANTAC) 300 MG tablet ranitidine 300 mg tablet    . acetaminophen (TYLENOL) 500 MG tablet Take 1,000 mg by mouth every 6 (six) hours as needed for moderate pain or headache.    . ergocalciferol (VITAMIN D2) 50000 units capsule Take 50,000 Units by mouth every Wednesday.    . methylPREDNISolone (MEDROL DOSEPAK) 4 MG TBPK tablet Taper as directed 21 tablet 0   No current facility-administered medications for this visit.     Allergies: Sulfa antibiotics  Past Medical History:  Diagnosis Date  . Anxiety   . Depression   . Endometriosis   . GERD (gastroesophageal reflux disease)   . Heart murmur    at birth, not detected as adule  . History of kidney stones   . HLD (hyperlipidemia)   . HTN (hypertension)    patient unsure no meds  . Kidney stones   . Missed abortion 12/30/2017  . PONV (postoperative nausea and vomiting)     Past Surgical History:  Procedure Laterality Date  . ANAL FISTULECTOMY  2005  . DILATION AND EVACUATION N/A 12/30/2017   Procedure: DILATATION AND EVACUATION;  Surgeon: Sherian Rein, MD;  Location: [facility];  Service: Gynecology;  Laterality: N/A;  . ENDOMETRIAL ABLATION Bilateral 2001   with ovarian cyst removal  . EYE SURGERY     BMT  . URETHRA SURGERY  2010   with stent placed, for kidney stones  . URETHRA SURGERY     stent removed    Family History  Problem Relation Age of Onset  . Lymphoma Mother        spleen mets to liver and lungs  .  Irritable bowel syndrome Mother   . Basal cell carcinoma Father   . Heart disease Father        CABG x 5  . Stroke Father   . Diabetes Father   . Colon polyps Father   . Heart attack Father   . Hyperlipidemia Father   . Hypertension Father   . Colon cancer Maternal Grandmother   . Irritable bowel syndrome Maternal Aunt   . Diabetes Sister   . Hypertension Sister   . Hyperlipidemia Sister   . Polycystic ovary syndrome Sister     Social History   Tobacco Use  . Smoking status: Never Smoker  . Smokeless tobacco: Never Used  Substance Use Topics  . Alcohol use: No    Subjective:  Started with itchy rash on legs/ back last night; denies any new soaps, foods, detergents or medications.  Did take the flu shot almost a week prior to onset; has been using Benadryl with some benefit;   LMP- yesterday    Objective:  Vitals:   06/21/18 1136  BP: 128/78  Pulse: (!) 107  Temp: 97.9 F (36.6 C)  TempSrc: Oral  SpO2: 98%  Weight: 191 lb 6.4 oz (86.8 kg)  Height: 5\' 3"  (1.6 m)    General: Well developed, well nourished, in no acute distress  Skin : Warm and  dry. Erythematous macular rash noted on back, upper extremities Head: Normocephalic and atraumatic  Lungs: Respirations unlabored;  Neurologic: Alert and oriented; speech intact; face symmetrical; moves all extremities well; CNII-XII intact without focal deficit   Assessment:  1. Dermatitis     Plan:  Medrol dose pak- take as directed; continue Zantac and Benadryl; work note as requested; follow-up worse, no better.  No follow-ups on file.  No orders of the defined types were placed in this encounter.   Requested Prescriptions   Signed Prescriptions Disp Refills  . methylPREDNISolone (MEDROL DOSEPAK) 4 MG TBPK tablet 21 tablet 0    Sig: Taper as directed

## 2018-06-30 ENCOUNTER — Encounter: Payer: Self-pay | Admitting: Internal Medicine

## 2018-07-15 ENCOUNTER — Other Ambulatory Visit: Payer: Self-pay | Admitting: Gastroenterology

## 2018-08-13 ENCOUNTER — Other Ambulatory Visit: Payer: Self-pay | Admitting: Gastroenterology

## 2018-12-24 DIAGNOSIS — Z03818 Encounter for observation for suspected exposure to other biological agents ruled out: Secondary | ICD-10-CM | POA: Diagnosis not present

## 2018-12-24 DIAGNOSIS — R0989 Other specified symptoms and signs involving the circulatory and respiratory systems: Secondary | ICD-10-CM | POA: Diagnosis not present

## 2018-12-24 DIAGNOSIS — Z20828 Contact with and (suspected) exposure to other viral communicable diseases: Secondary | ICD-10-CM | POA: Diagnosis not present

## 2019-03-16 DIAGNOSIS — N978 Female infertility of other origin: Secondary | ICD-10-CM | POA: Diagnosis not present

## 2019-03-16 DIAGNOSIS — Z3181 Encounter for male factor infertility in female patient: Secondary | ICD-10-CM | POA: Diagnosis not present

## 2019-03-23 DIAGNOSIS — R0989 Other specified symptoms and signs involving the circulatory and respiratory systems: Secondary | ICD-10-CM | POA: Diagnosis not present

## 2019-03-23 DIAGNOSIS — Z20828 Contact with and (suspected) exposure to other viral communicable diseases: Secondary | ICD-10-CM | POA: Diagnosis not present

## 2019-03-24 ENCOUNTER — Ambulatory Visit (INDEPENDENT_AMBULATORY_CARE_PROVIDER_SITE_OTHER): Payer: BC Managed Care – PPO | Admitting: Internal Medicine

## 2019-03-24 ENCOUNTER — Encounter: Payer: Self-pay | Admitting: Internal Medicine

## 2019-03-24 DIAGNOSIS — R0981 Nasal congestion: Secondary | ICD-10-CM

## 2019-03-24 NOTE — Assessment & Plan Note (Signed)
Negative covid-19 screen at her job yesterday and appears to have been a 1 hour test. She was notified of this and will continue xyzal and add flonase. Use tylenol prn for headache. Could have been the saharan dust migration causing increased symptoms.

## 2019-03-24 NOTE — Progress Notes (Signed)
Virtual Visit via Video Note  I connected with Megan Carrillo on 03/24/19 at 11:00 AM EDT by a video enabled telemedicine application and verified that I am speaking with the correct person using two identifiers.  The patient and the provider were at separate locations throughout the entire encounter.   I discussed the limitations of evaluation and management by telemedicine and the availability of in person appointments. The patient expressed understanding and agreed to proceed.  History of Present Illness: The patient is a 48 y.o. female with visit for nose congestion, sore throat, headaches. Was tested for covid-19 at her job yesterday. She did not work yesterday or today. Started 2-3 days ago. Has no fevers or chills or muscle aches. Denies diarrhea or abdominal pain or nausea. Overall it is improving and started xyzal 2 days ago. Has tried xyzal and tylenol for the headaches which helped some as well.  Observations/Objective: Appearance: normal, breathing appears normal, casual grooming, abdomen does not appear distended, throat not well visualized due to poor video quality, mental status is A and O times 3  Assessment and Plan: See problem oriented charting  Follow Up Instructions: advised to continue xyzal, start flonase she has at home, informed of negative covid-19 results from [facility] 03/23/19  I discussed the assessment and treatment plan with the patient. The patient was provided an opportunity to ask questions and all were answered. The patient agreed with the plan and demonstrated an understanding of the instructions.   The patient was advised to call back or seek an in-person evaluation if the symptoms worsen or if the condition fails to improve as anticipated.  Hoyt Koch, MD

## 2019-04-25 DIAGNOSIS — Z3169 Encounter for other general counseling and advice on procreation: Secondary | ICD-10-CM | POA: Diagnosis not present

## 2019-04-25 DIAGNOSIS — N978 Female infertility of other origin: Secondary | ICD-10-CM | POA: Diagnosis not present

## 2019-05-12 ENCOUNTER — Encounter: Payer: Self-pay | Admitting: Internal Medicine

## 2019-05-12 ENCOUNTER — Ambulatory Visit (INDEPENDENT_AMBULATORY_CARE_PROVIDER_SITE_OTHER): Payer: BC Managed Care – PPO | Admitting: Internal Medicine

## 2019-05-12 DIAGNOSIS — R079 Chest pain, unspecified: Secondary | ICD-10-CM | POA: Insufficient documentation

## 2019-05-12 DIAGNOSIS — R110 Nausea: Secondary | ICD-10-CM | POA: Insufficient documentation

## 2019-05-12 MED ORDER — VITAMIN E 200 UNITS PO TABS: 1.0000 | ORAL_TABLET | Freq: Every day | ORAL | Status: DC

## 2019-05-12 MED ORDER — COENZYME Q10 30 MG PO CAPS: 30.0000 mg | ORAL_CAPSULE | Freq: Three times a day (TID) | ORAL | Status: DC

## 2019-05-12 MED ORDER — OMEPRAZOLE 20 MG PO CPDR: 20.0000 mg | DELAYED_RELEASE_CAPSULE | Freq: Every day | ORAL | 3 refills | Status: AC

## 2019-05-12 MED ORDER — VITAMIN C 500 MG PO TABS: 500.0000 mg | ORAL_TABLET | Freq: Every day | ORAL | Status: DC

## 2019-05-12 NOTE — Assessment & Plan Note (Signed)
Having intermittent chest pain-not related to activity Does not sound cardiac in nature He does follow with cardiology and has an appointment with them Possibly related to nausea, atypical GERD

## 2019-05-12 NOTE — Assessment & Plan Note (Signed)
She has had intermittent nausea for 2 months.  It comes and goes throughout the day and is not related to eating.  Seems to be worse in the evening.  She feels her GERD is controlled-has a couple times a week depending on what she eats She is taking many supplements, which could be a cause-she started these about 2 months ago Advised her to hold her supplements temporarily to see if the nausea improves-if it does can try restarting supplements one at a time Consider increasing Prilosec to twice daily Call if no improvement

## 2019-05-12 NOTE — Progress Notes (Signed)
Virtual Visit via Telephone Note  I connected with Megan Carrillo on 05/12/19 at 11:00 AM EDT by telephone and verified that I am speaking with the correct person using two identifiers.   I discussed the limitations of evaluation and management by telemedicine and the availability of in person appointments. The patient expressed understanding and agreed to proceed.  The patient is currently at home and I am in the office.    No referring provider.    History of Present Illness: This is an acute visit for constant nausea  Her nausea started 2 months ago.  It is intermittent throughout the day, but tends to be worse at night.  She will have it several times a day.  It is not related to eating.  She denies any vomiting.  She is taking over-the-counter Prilosec once a day in the evening.  She thinks her heartburn is controlled-she will have a couple of times a week depending on what she eats.  She has had some periodic chest pain during the day.  She has an appointment with her cardiologist for October.  The chest pain has not gotten worse and is not related to activity.  Chest pain is not related to the nausea.  She is undergoing fertility treatment.  She just started them.  So she knows she is not pregnant.   Review of Systems  Constitutional: Negative for chills and fever.  HENT: Negative for sore throat.   Respiratory: Negative for cough, shortness of breath and wheezing.   Cardiovascular: Positive for chest pain (periodically during day). Negative for palpitations and leg swelling.       Occasionally can;t take a deep breathe  Gastrointestinal: Positive for heartburn (maybe a couple of time a week) and nausea. Negative for abdominal pain, constipation, diarrhea and vomiting.  Genitourinary: Negative for dysuria, frequency and hematuria.  Musculoskeletal: Positive for neck pain (today only).  Neurological: Positive for headaches (occasional - allergies). Negative for dizziness.     Medication list reviewed and updated.  Social History   Socioeconomic History  . Marital status: Married    Spouse name: Not on file  . Number of children: 3  . Years of education: Not on file  . Highest education level: Not on file  Occupational History  . Occupation: receptionist    Comment: Chief Financial Officer  Social Needs  . Financial resource strain: Not on file  . Food insecurity    Worry: Not on file    Inability: Not on file  . Transportation needs    Medical: Not on file    Non-medical: Not on file  Tobacco Use  . Smoking status: Never Smoker  . Smokeless tobacco: Never Used  Substance and Sexual Activity  . Alcohol use: No  . Drug use: No  . Sexual activity: Yes    Birth control/protection: Pill  Lifestyle  . Physical activity    Days per week: Not on file    Minutes per session: Not on file  . Stress: Not on file  Relationships  . Social Herbalist on phone: Not on file    Gets together: Not on file    Attends religious service: Not on file    Active member of club or organization: Not on file    Attends meetings of clubs or organizations: Not on file    Relationship status: Not on file  Other Topics Concern  . 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   Follow Up Instructions:    I discussed the assessment and treatment plan with the patient. The patient was provided an opportunity to ask questions and all were answered. The patient agreed with the plan and demonstrated an understanding of the instructions.   The patient was advised to call back or seek an in-person evaluation if the symptoms worsen or if the condition fails to improve as anticipated.    Pincus SanesStacy J Dayle Sherpa, MD

## 2019-06-06 DIAGNOSIS — H04123 Dry eye syndrome of bilateral lacrimal glands: Secondary | ICD-10-CM | POA: Diagnosis not present

## 2019-06-16 DIAGNOSIS — N978 Female infertility of other origin: Secondary | ICD-10-CM | POA: Diagnosis not present

## 2019-06-17 DIAGNOSIS — Z0183 Encounter for blood typing: Secondary | ICD-10-CM | POA: Diagnosis not present

## 2019-06-17 DIAGNOSIS — Z1329 Encounter for screening for other suspected endocrine disorder: Secondary | ICD-10-CM | POA: Diagnosis not present

## 2019-06-17 DIAGNOSIS — Z1159 Encounter for screening for other viral diseases: Secondary | ICD-10-CM | POA: Diagnosis not present

## 2019-06-17 DIAGNOSIS — Z114 Encounter for screening for human immunodeficiency virus [HIV]: Secondary | ICD-10-CM | POA: Diagnosis not present

## 2019-06-17 DIAGNOSIS — Z789 Other specified health status: Secondary | ICD-10-CM | POA: Diagnosis not present

## 2019-06-17 DIAGNOSIS — Z0000 Encounter for general adult medical examination without abnormal findings: Secondary | ICD-10-CM | POA: Diagnosis not present

## 2019-06-17 DIAGNOSIS — Z113 Encounter for screening for infections with a predominantly sexual mode of transmission: Secondary | ICD-10-CM | POA: Diagnosis not present

## 2019-06-23 DIAGNOSIS — Z1231 Encounter for screening mammogram for malignant neoplasm of breast: Secondary | ICD-10-CM | POA: Diagnosis not present

## 2019-06-23 DIAGNOSIS — Z6833 Body mass index (BMI) 33.0-33.9, adult: Secondary | ICD-10-CM | POA: Diagnosis not present

## 2019-06-23 DIAGNOSIS — Z130 Encounter for screening for diseases of the blood and blood-forming organs and certain disorders involving the immune mechanism: Secondary | ICD-10-CM | POA: Diagnosis not present

## 2019-06-23 DIAGNOSIS — Z124 Encounter for screening for malignant neoplasm of cervix: Secondary | ICD-10-CM | POA: Diagnosis not present

## 2019-06-23 DIAGNOSIS — R079 Chest pain, unspecified: Secondary | ICD-10-CM | POA: Diagnosis not present

## 2019-06-23 DIAGNOSIS — R5383 Other fatigue: Secondary | ICD-10-CM | POA: Diagnosis not present

## 2019-06-23 DIAGNOSIS — Z01419 Encounter for gynecological examination (general) (routine) without abnormal findings: Secondary | ICD-10-CM | POA: Diagnosis not present

## 2019-07-08 DIAGNOSIS — N978 Female infertility of other origin: Secondary | ICD-10-CM | POA: Diagnosis not present

## 2019-07-18 DIAGNOSIS — R079 Chest pain, unspecified: Secondary | ICD-10-CM | POA: Diagnosis not present

## 2019-07-27 DIAGNOSIS — N978 Female infertility of other origin: Secondary | ICD-10-CM | POA: Diagnosis not present

## 2019-07-29 DIAGNOSIS — Z01818 Encounter for other preprocedural examination: Secondary | ICD-10-CM | POA: Diagnosis not present

## 2019-07-29 DIAGNOSIS — Z20828 Contact with and (suspected) exposure to other viral communicable diseases: Secondary | ICD-10-CM | POA: Diagnosis not present

## 2019-08-01 DIAGNOSIS — N840 Polyp of corpus uteri: Secondary | ICD-10-CM | POA: Diagnosis not present

## 2019-08-10 DIAGNOSIS — Z6833 Body mass index (BMI) 33.0-33.9, adult: Secondary | ICD-10-CM | POA: Diagnosis not present

## 2019-08-10 DIAGNOSIS — Z713 Dietary counseling and surveillance: Secondary | ICD-10-CM | POA: Diagnosis not present

## 2019-08-10 DIAGNOSIS — E6609 Other obesity due to excess calories: Secondary | ICD-10-CM | POA: Diagnosis not present

## 2019-08-15 DIAGNOSIS — R0989 Other specified symptoms and signs involving the circulatory and respiratory systems: Secondary | ICD-10-CM | POA: Diagnosis not present

## 2019-08-15 DIAGNOSIS — Z20828 Contact with and (suspected) exposure to other viral communicable diseases: Secondary | ICD-10-CM | POA: Diagnosis not present

## 2019-08-23 DIAGNOSIS — N978 Female infertility of other origin: Secondary | ICD-10-CM | POA: Diagnosis not present

## 2019-09-05 DIAGNOSIS — N978 Female infertility of other origin: Secondary | ICD-10-CM | POA: Diagnosis not present

## 2019-09-20 DIAGNOSIS — Z3200 Encounter for pregnancy test, result unknown: Secondary | ICD-10-CM | POA: Diagnosis not present

## 2019-09-26 DIAGNOSIS — N978 Female infertility of other origin: Secondary | ICD-10-CM | POA: Diagnosis not present

## 2019-10-04 DIAGNOSIS — Z20828 Contact with and (suspected) exposure to other viral communicable diseases: Secondary | ICD-10-CM | POA: Diagnosis not present

## 2019-10-04 DIAGNOSIS — R0989 Other specified symptoms and signs involving the circulatory and respiratory systems: Secondary | ICD-10-CM | POA: Diagnosis not present

## 2019-10-05 DIAGNOSIS — J069 Acute upper respiratory infection, unspecified: Secondary | ICD-10-CM | POA: Diagnosis not present

## 2019-10-19 DIAGNOSIS — N978 Female infertility of other origin: Secondary | ICD-10-CM | POA: Diagnosis not present

## 2019-11-08 DIAGNOSIS — R197 Diarrhea, unspecified: Secondary | ICD-10-CM | POA: Diagnosis not present

## 2019-11-08 DIAGNOSIS — Z20822 Contact with and (suspected) exposure to covid-19: Secondary | ICD-10-CM | POA: Diagnosis not present

## 2019-11-08 DIAGNOSIS — R112 Nausea with vomiting, unspecified: Secondary | ICD-10-CM | POA: Diagnosis not present

## 2020-02-14 DIAGNOSIS — R0981 Nasal congestion: Secondary | ICD-10-CM | POA: Diagnosis not present

## 2020-02-14 DIAGNOSIS — R05 Cough: Secondary | ICD-10-CM | POA: Diagnosis not present

## 2020-02-14 DIAGNOSIS — Z20822 Contact with and (suspected) exposure to covid-19: Secondary | ICD-10-CM | POA: Diagnosis not present

## 2020-02-16 DIAGNOSIS — J309 Allergic rhinitis, unspecified: Secondary | ICD-10-CM | POA: Diagnosis not present

## 2020-02-16 DIAGNOSIS — R0982 Postnasal drip: Secondary | ICD-10-CM | POA: Diagnosis not present

## 2020-02-16 DIAGNOSIS — R05 Cough: Secondary | ICD-10-CM | POA: Diagnosis not present

## 2020-06-19 DIAGNOSIS — R519 Headache, unspecified: Secondary | ICD-10-CM | POA: Diagnosis not present

## 2020-06-19 DIAGNOSIS — J3489 Other specified disorders of nose and nasal sinuses: Secondary | ICD-10-CM | POA: Diagnosis not present

## 2020-06-19 DIAGNOSIS — R05 Cough: Secondary | ICD-10-CM | POA: Diagnosis not present

## 2020-06-19 DIAGNOSIS — U071 COVID-19: Secondary | ICD-10-CM | POA: Diagnosis not present

## 2020-06-19 DIAGNOSIS — Z20822 Contact with and (suspected) exposure to covid-19: Secondary | ICD-10-CM | POA: Diagnosis not present

## 2020-06-19 DIAGNOSIS — R0981 Nasal congestion: Secondary | ICD-10-CM | POA: Diagnosis not present

## 2020-07-13 DIAGNOSIS — E049 Nontoxic goiter, unspecified: Secondary | ICD-10-CM | POA: Diagnosis not present

## 2020-07-13 DIAGNOSIS — Z1389 Encounter for screening for other disorder: Secondary | ICD-10-CM | POA: Diagnosis not present

## 2020-07-13 DIAGNOSIS — Z1231 Encounter for screening mammogram for malignant neoplasm of breast: Secondary | ICD-10-CM | POA: Diagnosis not present

## 2020-07-13 DIAGNOSIS — Z130 Encounter for screening for diseases of the blood and blood-forming organs and certain disorders involving the immune mechanism: Secondary | ICD-10-CM | POA: Diagnosis not present

## 2020-07-13 DIAGNOSIS — Z01419 Encounter for gynecological examination (general) (routine) without abnormal findings: Secondary | ICD-10-CM | POA: Diagnosis not present

## 2020-07-13 DIAGNOSIS — Z6832 Body mass index (BMI) 32.0-32.9, adult: Secondary | ICD-10-CM | POA: Diagnosis not present

## 2020-07-13 DIAGNOSIS — E559 Vitamin D deficiency, unspecified: Secondary | ICD-10-CM | POA: Diagnosis not present

## 2020-08-06 DIAGNOSIS — Z3169 Encounter for other general counseling and advice on procreation: Secondary | ICD-10-CM | POA: Diagnosis not present

## 2020-08-27 ENCOUNTER — Ambulatory Visit: Payer: BC Managed Care – PPO | Admitting: Physical Therapy

## 2020-08-27 DIAGNOSIS — G5602 Carpal tunnel syndrome, left upper limb: Secondary | ICD-10-CM | POA: Diagnosis not present

## 2020-08-27 DIAGNOSIS — G5601 Carpal tunnel syndrome, right upper limb: Secondary | ICD-10-CM | POA: Diagnosis not present

## 2020-09-13 ENCOUNTER — Ambulatory Visit: Payer: BC Managed Care – PPO | Admitting: Physical Therapy

## 2020-10-02 DIAGNOSIS — N978 Female infertility of other origin: Secondary | ICD-10-CM | POA: Diagnosis not present

## 2020-10-09 DIAGNOSIS — R079 Chest pain, unspecified: Secondary | ICD-10-CM | POA: Diagnosis not present

## 2020-10-19 DIAGNOSIS — G5603 Carpal tunnel syndrome, bilateral upper limbs: Secondary | ICD-10-CM | POA: Diagnosis not present

## 2020-10-29 DIAGNOSIS — G5601 Carpal tunnel syndrome, right upper limb: Secondary | ICD-10-CM | POA: Diagnosis not present

## 2020-10-30 DIAGNOSIS — N978 Female infertility of other origin: Secondary | ICD-10-CM | POA: Diagnosis not present

## 2020-11-13 ENCOUNTER — Encounter: Payer: Self-pay | Admitting: Internal Medicine

## 2020-11-13 DIAGNOSIS — N978 Female infertility of other origin: Secondary | ICD-10-CM

## 2020-11-16 DIAGNOSIS — G5601 Carpal tunnel syndrome, right upper limb: Secondary | ICD-10-CM | POA: Diagnosis not present

## 2020-11-18 NOTE — Addendum Note (Signed)
Addended by: Pincus Sanes on: 11/18/2020 04:54 PM   Modules accepted: Orders

## 2020-11-21 DIAGNOSIS — Z79899 Other long term (current) drug therapy: Secondary | ICD-10-CM | POA: Diagnosis not present

## 2020-11-21 DIAGNOSIS — R635 Abnormal weight gain: Secondary | ICD-10-CM | POA: Diagnosis not present

## 2020-11-21 DIAGNOSIS — R5383 Other fatigue: Secondary | ICD-10-CM | POA: Diagnosis not present

## 2020-11-21 DIAGNOSIS — R0602 Shortness of breath: Secondary | ICD-10-CM | POA: Diagnosis not present

## 2020-11-22 DIAGNOSIS — Z319 Encounter for procreative management, unspecified: Secondary | ICD-10-CM | POA: Diagnosis not present

## 2020-11-22 DIAGNOSIS — Z113 Encounter for screening for infections with a predominantly sexual mode of transmission: Secondary | ICD-10-CM | POA: Diagnosis not present

## 2021-01-08 DIAGNOSIS — H6690 Otitis media, unspecified, unspecified ear: Secondary | ICD-10-CM | POA: Diagnosis not present

## 2021-01-08 DIAGNOSIS — J329 Chronic sinusitis, unspecified: Secondary | ICD-10-CM | POA: Diagnosis not present

## 2021-01-15 DIAGNOSIS — N978 Female infertility of other origin: Secondary | ICD-10-CM | POA: Diagnosis not present

## 2021-02-07 DIAGNOSIS — H9212 Otorrhea, left ear: Secondary | ICD-10-CM | POA: Diagnosis not present

## 2021-02-07 DIAGNOSIS — H7312 Chronic myringitis, left ear: Secondary | ICD-10-CM | POA: Diagnosis not present

## 2021-02-14 DIAGNOSIS — N978 Female infertility of other origin: Secondary | ICD-10-CM | POA: Diagnosis not present

## 2021-02-25 DIAGNOSIS — H7291 Unspecified perforation of tympanic membrane, right ear: Secondary | ICD-10-CM | POA: Diagnosis not present

## 2021-02-25 DIAGNOSIS — H6691 Otitis media, unspecified, right ear: Secondary | ICD-10-CM | POA: Diagnosis not present

## 2021-03-04 DIAGNOSIS — N978 Female infertility of other origin: Secondary | ICD-10-CM | POA: Diagnosis not present

## 2021-03-07 DIAGNOSIS — Z3141 Encounter for fertility testing: Secondary | ICD-10-CM | POA: Diagnosis not present

## 2021-03-22 DIAGNOSIS — Z3141 Encounter for fertility testing: Secondary | ICD-10-CM | POA: Diagnosis not present

## 2021-04-01 DIAGNOSIS — N978 Female infertility of other origin: Secondary | ICD-10-CM | POA: Diagnosis not present

## 2021-04-01 DIAGNOSIS — E289 Ovarian dysfunction, unspecified: Secondary | ICD-10-CM | POA: Diagnosis not present

## 2021-04-15 DIAGNOSIS — N978 Female infertility of other origin: Secondary | ICD-10-CM | POA: Diagnosis not present

## 2021-05-02 DIAGNOSIS — N978 Female infertility of other origin: Secondary | ICD-10-CM | POA: Diagnosis not present

## 2021-05-07 DIAGNOSIS — N978 Female infertility of other origin: Secondary | ICD-10-CM | POA: Diagnosis not present

## 2021-05-09 DIAGNOSIS — E289 Ovarian dysfunction, unspecified: Secondary | ICD-10-CM | POA: Diagnosis not present

## 2021-06-03 DIAGNOSIS — N3001 Acute cystitis with hematuria: Secondary | ICD-10-CM | POA: Diagnosis not present

## 2021-06-05 DIAGNOSIS — Z03818 Encounter for observation for suspected exposure to other biological agents ruled out: Secondary | ICD-10-CM | POA: Diagnosis not present

## 2021-06-05 DIAGNOSIS — N10 Acute tubulo-interstitial nephritis: Secondary | ICD-10-CM | POA: Diagnosis not present

## 2021-06-05 DIAGNOSIS — N390 Urinary tract infection, site not specified: Secondary | ICD-10-CM | POA: Diagnosis not present

## 2021-06-05 DIAGNOSIS — H66003 Acute suppurative otitis media without spontaneous rupture of ear drum, bilateral: Secondary | ICD-10-CM | POA: Diagnosis not present

## 2021-06-05 DIAGNOSIS — R112 Nausea with vomiting, unspecified: Secondary | ICD-10-CM | POA: Diagnosis not present

## 2021-06-05 DIAGNOSIS — R1110 Vomiting, unspecified: Secondary | ICD-10-CM | POA: Diagnosis not present

## 2021-06-05 DIAGNOSIS — J019 Acute sinusitis, unspecified: Secondary | ICD-10-CM | POA: Diagnosis not present

## 2021-06-07 DIAGNOSIS — J019 Acute sinusitis, unspecified: Secondary | ICD-10-CM | POA: Diagnosis not present

## 2021-06-07 DIAGNOSIS — N10 Acute tubulo-interstitial nephritis: Secondary | ICD-10-CM | POA: Diagnosis not present

## 2021-06-07 DIAGNOSIS — B373 Candidiasis of vulva and vagina: Secondary | ICD-10-CM | POA: Diagnosis not present

## 2021-06-07 DIAGNOSIS — B9689 Other specified bacterial agents as the cause of diseases classified elsewhere: Secondary | ICD-10-CM | POA: Diagnosis not present

## 2021-07-29 ENCOUNTER — Ambulatory Visit: Payer: BC Managed Care – PPO | Admitting: Internal Medicine

## 2021-07-31 ENCOUNTER — Other Ambulatory Visit: Payer: Self-pay

## 2021-07-31 ENCOUNTER — Encounter: Payer: Self-pay | Admitting: Emergency Medicine

## 2021-07-31 ENCOUNTER — Ambulatory Visit
Admission: EM | Admit: 2021-07-31 | Discharge: 2021-07-31 | Disposition: A | Discharge status: Home / Self Care | Payer: 59

## 2021-07-31 DIAGNOSIS — J101 Influenza due to other identified influenza virus with other respiratory manifestations: Secondary | ICD-10-CM

## 2021-07-31 LAB — POCT INFLUENZA A/B
Influenza A, POC: POSITIVE — AB
Influenza B, POC: NEGATIVE

## 2021-07-31 MED ORDER — OSELTAMIVIR PHOSPHATE 75 MG PO CAPS: 75.0000 mg | ORAL_CAPSULE | Freq: Two times a day (BID) | ORAL | 0 refills | Status: DC

## 2021-07-31 NOTE — ED Provider Notes (Signed)
Megan Carrillo    CSN: [id number] Arrival date & time: 07/31/21  1021      History   Chief Complaint Chief Complaint  Patient presents with   Sore Throat   Nasal Congestion   Otalgia   Cough    HPI Megan Carrillo is a 50 y.o. female.  Patient presents with 1 day history of ear pain, runny nose, congestion, sore throat, cough, diarrhea.  2 episodes of diarrhea since last night.  She also reports low-grade fever of 99.9.  No rash, shortness of breath, vomiting, diarrhea, or other symptoms.  Treating at home with OTC cold medication.  Her medical history includes hypertension.  The history is provided by the patient and medical records.   Past Medical History:  Diagnosis Date   Anxiety    Depression    Endometriosis    GERD (gastroesophageal reflux disease)    Heart murmur    at birth, not detected as adule   History of kidney stones    HLD (hyperlipidemia)    HTN (hypertension)    patient unsure no meds   Kidney stones    Missed abortion 12/30/2017   PONV (postoperative nausea and vomiting)     Patient Active Problem List   Diagnosis Date Noted   Nausea 05/12/2019   Chest pain 05/12/2019   Sinus congestion 03/24/2019   Anxiety and depression 06/15/2018   GERD (gastroesophageal reflux disease) 08/22/2016   History of nephrolithiasis 08/22/2016   Vitamin D deficiency 08/22/2016    Past Surgical History:  Procedure Laterality Date   ANAL FISTULECTOMY  2005   DILATION AND EVACUATION N/A 12/30/2017   Procedure: DILATATION AND EVACUATION;  Surgeon: Sherian Rein, MD;  Location: [facility];  Service: Gynecology;  Laterality: N/A;   ENDOMETRIAL ABLATION Bilateral 2001   with ovarian cyst removal   EYE SURGERY     BMT   URETHRA SURGERY  2010   with stent placed, for kidney stones   URETHRA SURGERY     stent removed    OB History   No obstetric history on file.      Home Medications    Prior to Admission medications   Medication Sig Start  Date End Date Taking? Authorizing Provider  norgestimate-ethinyl estradiol (ORTHO-CYCLEN) 0.25-35 MG-MCG tablet Take by mouth. 01/19/20  Yes [provider]  oseltamivir (TAMIFLU) 75 MG capsule Take 1 capsule (75 mg total) by mouth every 12 (twelve) hours. 07/31/21  Yes Mickie Bail, NP  acetaminophen (TYLENOL) 500 MG tablet Take 1,000 mg by mouth every 6 (six) hours as needed for moderate pain or headache.    [provider]  co-enzyme Q-10 30 MG capsule Take 1 capsule (30 mg total) by mouth 3 (three) times daily. 05/12/19   Pincus Sanes, MD  ergocalciferol (VITAMIN D2) 50000 units capsule Take 50,000 Units by mouth every Wednesday.    [provider]  estradiol (ESTRACE) 1 MG tablet Take by mouth. 07/25/21   [provider]  omeprazole (PRILOSEC) 20 MG capsule Take 1 capsule (20 mg total) by mouth daily. 05/12/19   Pincus Sanes, MD  Prenatal Vit-Fe Fumarate-FA (PRENATAL VITAMIN PO) Take 1 tablet by mouth daily.    [provider]  vitamin C (ASCORBIC ACID) 500 MG tablet Take 1 tablet (500 mg total) by mouth daily. 05/12/19   Pincus Sanes, MD  Vitamin E 200 units TABS Take 1 tablet by mouth daily. 05/12/19   Pincus Sanes, MD  Family History Family History  Problem Relation Age of Onset   Lymphoma Mother        spleen mets to liver and lungs   Irritable bowel syndrome Mother    Basal cell carcinoma Father    Heart disease Father        CABG x 5   Stroke Father    Diabetes Father    Colon polyps Father    Heart attack Father    Hyperlipidemia Father    Hypertension Father    Colon cancer Maternal Grandmother    Irritable bowel syndrome Maternal Aunt    Diabetes Sister    Hypertension Sister    Hyperlipidemia Sister    Polycystic ovary syndrome Sister     Social History Social History   Tobacco Use   Smoking status: Never   Smokeless tobacco: Never  Vaping Use   Vaping Use: Never used  Substance Use Topics   Alcohol use: No    Drug use: No     Allergies   Sulfa antibiotics   Review of Systems Review of Systems  Constitutional:  Positive for fever. Negative for chills.  HENT:  Positive for congestion, ear pain, rhinorrhea and sore throat.   Respiratory:  Positive for cough. Negative for shortness of breath.   Cardiovascular:  Negative for chest pain and palpitations.  Gastrointestinal:  Positive for diarrhea. Negative for abdominal pain and vomiting.  Skin:  Negative for color change and rash.  All other systems reviewed and are negative.   Physical Exam Triage Vital Signs ED Triage Vitals [07/31/21 1030]  Enc Vitals Group     BP (!) 146/78     Pulse Rate 70     Resp      Temp 98.1 F (36.7 C)     Temp Source Oral     SpO2 97 %     Weight      Height      Head Circumference      Peak Flow      Pain Score      Pain Loc      Pain Edu?      Excl. in GC?    No data found.  Updated Vital Signs BP (!) 146/78 (BP Location: Left Arm)   Pulse 70   Temp 98.1 F (36.7 C) (Oral)   LMP  (LMP Unknown)   SpO2 97%   Visual Acuity Right Eye Distance:   Left Eye Distance:   Bilateral Distance:    Right Eye Near:   Left Eye Near:    Bilateral Near:     Physical Exam Vitals and nursing note reviewed.  Constitutional:      General: She is not in acute distress.    Appearance: She is well-developed.  HENT:     Head: Normocephalic and atraumatic.     Right Ear: Tympanic membrane normal.     Left Ear: Tympanic membrane normal.     Nose: Rhinorrhea present.     Mouth/Throat:     Mouth: Mucous membranes are moist.     Pharynx: Oropharynx is clear.  Eyes:     Conjunctiva/sclera: Conjunctivae normal.  Cardiovascular:     Rate and Rhythm: Normal rate and regular rhythm.     Heart sounds: Normal heart sounds.  Pulmonary:     Effort: Pulmonary effort is normal. No respiratory distress.     Breath sounds: Normal breath sounds.  Abdominal:     Palpations: Abdomen is soft.  Tenderness:  There is no abdominal tenderness.  Musculoskeletal:     Cervical back: Neck supple.  Skin:    General: Skin is warm and dry.  Neurological:     Mental Status: She is alert.  Psychiatric:        Mood and Affect: Mood normal.        Behavior: Behavior normal.     UC Treatments / Results  Labs (all labs ordered are listed, but only abnormal results are displayed) Labs Reviewed  POCT INFLUENZA A/B - Abnormal; Notable for the following components:      Result Value   Influenza A, POC Positive (*)    All other components within normal limits    EKG   Radiology No results found.  Procedures Procedures (including critical care time)  Medications Ordered in UC Medications - No data to display  Initial Impression / Assessment and Plan / UC Course  I have reviewed the triage vital signs and the nursing notes.  Pertinent labs & imaging results that were available during my care of the patient were reviewed by me and considered in my medical decision making (see chart for details).   Influenza A.  Rapid flu test positive for influenza A.  Treating with Tamiflu.  Discussed symptomatic treatment including Tylenol or ibuprofen as needed.  Education provided on influenza.  Instructed patient to follow-up with PCP if her symptoms are not improving.  She agrees to plan of care.    Final Clinical Impressions(s) / UC Diagnoses   Final diagnoses:  Influenza A     Discharge Instructions      Take the Tamiflu as directed.  Take Tylenol or ibuprofen as needed for fever or discomfort.  Follow-up with your primary care provider if your symptoms are not improving.      ED Prescriptions     Medication Sig Dispense Auth. Provider   oseltamivir (TAMIFLU) 75 MG capsule Take 1 capsule (75 mg total) by mouth every 12 (twelve) hours. 10 capsule Mickie Bail, NP      PDMP not reviewed this encounter.   Mickie Bail, NP 07/31/21 1103

## 2021-07-31 NOTE — Discharge Instructions (Addendum)
Take the Tamiflu as directed.  Take Tylenol or ibuprofen as needed for fever or discomfort.  Follow-up with your primary care provider if your symptoms are not improving.  

## 2021-07-31 NOTE — ED Triage Notes (Signed)
Pt c/o cough, runny nose, ST, and right ear pain sxs started yesterday.

## 2021-08-01 DIAGNOSIS — N978 Female infertility of other origin: Secondary | ICD-10-CM | POA: Diagnosis not present

## 2021-08-14 DIAGNOSIS — N978 Female infertility of other origin: Secondary | ICD-10-CM | POA: Diagnosis not present

## 2021-09-02 LAB — HM MAMMOGRAPHY

## 2021-10-24 ENCOUNTER — Ambulatory Visit
Admission: RE | Admit: 2021-10-24 | Discharge: 2021-10-24 | Disposition: A | Discharge status: Home / Self Care | Payer: BC Managed Care – PPO | Source: Ambulatory Visit | Attending: Emergency Medicine | Admitting: Emergency Medicine

## 2021-10-24 ENCOUNTER — Other Ambulatory Visit: Payer: Self-pay

## 2021-10-24 VITALS — BP 139/81 | HR 86 | Temp 98.4°F | Resp 18

## 2021-10-24 DIAGNOSIS — B349 Viral infection, unspecified: Secondary | ICD-10-CM | POA: Diagnosis not present

## 2021-10-24 LAB — POCT RAPID STREP A (OFFICE): Rapid Strep A Screen: NEGATIVE

## 2021-10-24 LAB — POCT INFLUENZA A/B
Influenza A, POC: NEGATIVE
Influenza B, POC: NEGATIVE

## 2021-10-24 NOTE — ED Triage Notes (Signed)
Pt here with sore throat, cough, otalgia, diarrhea, and body aches x 3 days. Denies fever.

## 2021-10-24 NOTE — Discharge Instructions (Addendum)
Your strep and flu tests are negative.   ° °Your COVID test is pending.  You should self quarantine until the test result is back.   ° °Take Tylenol or ibuprofen as needed for fever or discomfort.  Rest and keep yourself hydrated.   ° °Follow-up with your primary care provider if your symptoms are not improving.   ° ° °

## 2021-10-24 NOTE — ED Provider Notes (Signed)
[facility]    CSN: [id number] Arrival date & time: 10/24/21  1550      History   Chief Complaint Chief Complaint  Patient presents with   Sore Throat   Cough   Otalgia   Generalized Body Aches    HPI Megan Carrillo is a 51 y.o. female. Patient presents with sore throat, fatigue, chills, body aches, ear pain, congestion, runny nose, cough, and diarrhea since yesterday.  No fever, rash, shortness of breath, vomiting, or other symptoms.  Treatment at home with Aleve.  She works at an [employer], [employer].    The history is provided by the patient and medical records.   Past Medical History:  Diagnosis Date   Anxiety    Depression    Endometriosis    GERD (gastroesophageal reflux disease)    Heart murmur    at birth, not detected as adule   History of kidney stones    HLD (hyperlipidemia)    HTN (hypertension)    patient unsure no meds   Kidney stones    Missed abortion 12/30/2017   PONV (postoperative nausea and vomiting)     Patient Active Problem List   Diagnosis Date Noted   Nausea 05/12/2019   Chest pain 05/12/2019   Sinus congestion 03/24/2019   Anxiety and depression 06/15/2018   GERD (gastroesophageal reflux disease) 08/22/2016   History of nephrolithiasis 08/22/2016   Vitamin D deficiency 08/22/2016    Past Surgical History:  Procedure Laterality Date   ANAL FISTULECTOMY  2005   DILATION AND EVACUATION N/A 12/30/2017   Procedure: DILATATION AND EVACUATION;  Surgeon: Sherian Rein, MD;  Location: [facility];  Service: Gynecology;  Laterality: N/A;   ENDOMETRIAL ABLATION Bilateral 2001   with ovarian cyst removal   EYE SURGERY     BMT   URETHRA SURGERY  2010   with stent placed, for kidney stones   URETHRA SURGERY     stent removed    OB History   No obstetric history on file.      Home Medications    Prior to Admission medications   Medication Sig Start Date End Date Taking? Authorizing Provider  acetaminophen (TYLENOL) 500  MG tablet Take 1,000 mg by mouth every 6 (six) hours as needed for moderate pain or headache.    [provider]  co-enzyme Q-10 30 MG capsule Take 1 capsule (30 mg total) by mouth 3 (three) times daily. 05/12/19   Pincus Sanes, MD  ergocalciferol (VITAMIN D2) 50000 units capsule Take 50,000 Units by mouth every Wednesday.    [provider]  estradiol (ESTRACE) 1 MG tablet Take by mouth. 07/25/21   [provider]  norgestimate-ethinyl estradiol (ORTHO-CYCLEN) 0.25-35 MG-MCG tablet Take by mouth. 01/19/20   [provider]  omeprazole (PRILOSEC) 20 MG capsule Take 1 capsule (20 mg total) by mouth daily. 05/12/19   Pincus Sanes, MD  oseltamivir (TAMIFLU) 75 MG capsule Take 1 capsule (75 mg total) by mouth every 12 (twelve) hours. 07/31/21   Mickie Bail, NP  Prenatal Vit-Fe Fumarate-FA (PRENATAL VITAMIN PO) Take 1 tablet by mouth daily.    [provider]  vitamin C (ASCORBIC ACID) 500 MG tablet Take 1 tablet (500 mg total) by mouth daily. 05/12/19   Pincus Sanes, MD  Vitamin E 200 units TABS Take 1 tablet by mouth daily. 05/12/19   Pincus Sanes, MD    Family History Family History  Problem Relation Age of Onset  Lymphoma Mother        spleen mets to liver and lungs   Irritable bowel syndrome Mother    Basal cell carcinoma Father    Heart disease Father        CABG x 5   Stroke Father    Diabetes Father    Colon polyps Father    Heart attack Father    Hyperlipidemia Father    Hypertension Father    Colon cancer Maternal Grandmother    Irritable bowel syndrome Maternal Aunt    Diabetes Sister    Hypertension Sister    Hyperlipidemia Sister    Polycystic ovary syndrome Sister     Social History Social History   Tobacco Use   Smoking status: Never   Smokeless tobacco: Never  Vaping Use   Vaping Use: Never used  Substance Use Topics   Alcohol use: No   Drug use: No     Allergies   Sulfa antibiotics   Review of  Systems Review of Systems  Constitutional:  Positive for chills and fatigue. Negative for fever.  HENT:  Positive for congestion, ear pain, postnasal drip, rhinorrhea and sore throat.   Respiratory:  Positive for cough. Negative for shortness of breath.   Cardiovascular:  Negative for chest pain and palpitations.  Gastrointestinal:  Positive for diarrhea. Negative for abdominal pain and vomiting.  Skin:  Negative for color change and rash.  All other systems reviewed and are negative.   Physical Exam Triage Vital Signs ED Triage Vitals  Enc Vitals Group     BP      Pulse      Resp      Temp      Temp src      SpO2      Weight      Height      Head Circumference      Peak Flow      Pain Score      Pain Loc      Pain Edu?      Excl. in GC?    No data found.  Updated Vital Signs BP 139/81 (BP Location: Left Arm)    Pulse 86    Temp 98.4 F (36.9 C)    Resp 18    SpO2 97%   Visual Acuity Right Eye Distance:   Left Eye Distance:   Bilateral Distance:    Right Eye Near:   Left Eye Near:    Bilateral Near:     Physical Exam Vitals and nursing note reviewed.  Constitutional:      General: She is not in acute distress.    Appearance: She is well-developed. She is ill-appearing.  HENT:     Right Ear: Tympanic membrane normal.     Left Ear: Tympanic membrane normal.     Nose: Congestion and rhinorrhea present.     Mouth/Throat:     Mouth: Mucous membranes are moist.     Pharynx: Posterior oropharyngeal erythema present.  Cardiovascular:     Rate and Rhythm: Normal rate and regular rhythm.     Heart sounds: Normal heart sounds.  Pulmonary:     Effort: Pulmonary effort is normal. No respiratory distress.     Breath sounds: Normal breath sounds.  Abdominal:     Palpations: Abdomen is soft.     Tenderness: There is no abdominal tenderness.  Musculoskeletal:     Cervical back: Neck supple.  Skin:    General: Skin is warm and  dry.  Neurological:     Mental  Status: She is alert.  Psychiatric:        Mood and Affect: Mood normal.        Behavior: Behavior normal.     UC Treatments / Results  Labs (all labs ordered are listed, but only abnormal results are displayed) Labs Reviewed  NOVEL CORONAVIRUS, NAA  POCT RAPID STREP A (OFFICE)  POCT INFLUENZA A/B    EKG   Radiology No results found.  Procedures Procedures (including critical care time)  Medications Ordered in UC Medications - No data to display  Initial Impression / Assessment and Plan / UC Course  I have reviewed the triage vital signs and the nursing notes.  Pertinent labs & imaging results that were available during my care of the patient were reviewed by me and considered in my medical decision making (see chart for details).    Viral illness.  Rapid strep negative.  Rapid flu negative.  COVID pending.  Instructed patient to self quarantine per CDC guidelines.  Discussed symptomatic treatment including Tylenol or ibuprofen, rest, hydration.  Instructed patient to follow up with PCP if symptoms are not improving.  Patient agrees to plan of care.   Final Clinical Impressions(s) / UC Diagnoses   Final diagnoses:  Viral illness     Discharge Instructions      Your strep and flu tests are negative.  Your COVID test is pending.  You should self quarantine until the test result is back.    Take Tylenol or ibuprofen as needed for fever or discomfort.  Rest and keep yourself hydrated.    Follow-up with your primary care provider if your symptoms are not improving.         ED Prescriptions   None    PDMP not reviewed this encounter.   Mickie Bailate, Genevive Printup H, NP 10/24/21 [phone]

## 2021-10-25 LAB — SARS-COV-2, NAA 2 DAY TAT

## 2021-10-25 LAB — NOVEL CORONAVIRUS, NAA: SARS-CoV-2, NAA: NOT DETECTED

## 2021-12-11 ENCOUNTER — Telehealth: Payer: Self-pay

## 2021-12-11 NOTE — Telephone Encounter (Signed)
Received referral for patient to have preconception counseling with one of our MFM's. Left voicemail for patient to call back and schedule with our clinic.  ?

## 2022-01-06 ENCOUNTER — Encounter: Payer: Self-pay | Admitting: Internal Medicine

## 2022-01-06 NOTE — Progress Notes (Signed)
Outside notes received. Information abstracted. Notes sent to scan.  

## 2022-01-07 ENCOUNTER — Encounter: Payer: Self-pay | Admitting: Internal Medicine

## 2022-01-29 ENCOUNTER — Telehealth: Payer: Self-pay

## 2022-01-29 NOTE — Telephone Encounter (Signed)
Called patient to schedule a preconception consult - left message for patient to call office back to schedule ?

## 2022-03-03 ENCOUNTER — Ambulatory Visit
Admission: EM | Admit: 2022-03-03 | Discharge: 2022-03-03 | Disposition: A | Discharge status: Home / Self Care | Payer: BC Managed Care – PPO | Attending: Emergency Medicine | Admitting: Emergency Medicine

## 2022-03-03 DIAGNOSIS — B349 Viral infection, unspecified: Secondary | ICD-10-CM | POA: Diagnosis not present

## 2022-03-03 NOTE — ED Provider Notes (Signed)
[facility]    CSN: [id number] Arrival date & time: 03/03/22  1004      History   Chief Complaint Chief Complaint  Patient presents with   Nasal Congestion    Headache, green/yellow mucus, sore throat - Entered by patient   Sore Throat   Headache    HPI Gwenlyn FoundKaren D Witt-Auth is a 51 y.o. female.  Patient presents with runny nose, congestion, postnasal drip, headache, sore throat, mild occasional cough since yesterday.  No treatment attempted.  No fever, rash, shortness of breath, vomiting, diarrhea, or other symptoms.  The history is provided by the patient and medical records.    Past Medical History:  Diagnosis Date   Anxiety    Depression    Endometriosis    GERD (gastroesophageal reflux disease)    Heart murmur    at birth, not detected as adule   History of kidney stones    HLD (hyperlipidemia)    HTN (hypertension)    patient unsure no meds   Kidney stones    Missed abortion 12/30/2017   PONV (postoperative nausea and vomiting)     Patient Active Problem List   Diagnosis Date Noted   Nausea 05/12/2019   Chest pain 05/12/2019   Sinus congestion 03/24/2019   Anxiety and depression 06/15/2018   GERD (gastroesophageal reflux disease) 08/22/2016   History of nephrolithiasis 08/22/2016   Vitamin D deficiency 08/22/2016    Past Surgical History:  Procedure Laterality Date   ANAL FISTULECTOMY  2005   DILATION AND EVACUATION N/A 12/30/2017   Procedure: DILATATION AND EVACUATION;  Surgeon: Sherian ReinBovard-Stuckert, Jody, MD;  Location: [facility];  Service: Gynecology;  Laterality: N/A;   ENDOMETRIAL ABLATION Bilateral 2001   with ovarian cyst removal   EYE SURGERY     BMT   URETHRA SURGERY  2010   with stent placed, for kidney stones   URETHRA SURGERY     stent removed    OB History   No obstetric history on file.      Home Medications    Prior to Admission medications   Medication Sig Start Date End Date Taking? Authorizing Provider  acetaminophen  (TYLENOL) 500 MG tablet Take 1,000 mg by mouth every 6 (six) hours as needed for moderate pain or headache.    [provider]  co-enzyme Q-10 30 MG capsule Take 1 capsule (30 mg total) by mouth 3 (three) times daily. 05/12/19   Pincus SanesBurns, Stacy J, MD  ergocalciferol (VITAMIN D2) 50000 units capsule Take 50,000 Units by mouth every Wednesday.    [provider]  estradiol (ESTRACE) 1 MG tablet Take by mouth. 07/25/21   [provider]  norgestimate-ethinyl estradiol (ORTHO-CYCLEN) 0.25-35 MG-MCG tablet Take by mouth. 01/19/20   [provider]  omeprazole (PRILOSEC) 20 MG capsule Take 1 capsule (20 mg total) by mouth daily. 05/12/19   Pincus SanesBurns, Stacy J, MD  oseltamivir (TAMIFLU) 75 MG capsule Take 1 capsule (75 mg total) by mouth every 12 (twelve) hours. 07/31/21   Mickie Bailate, Edword Cu H, NP  Prenatal Vit-Fe Fumarate-FA (PRENATAL VITAMIN PO) Take 1 tablet by mouth daily.    [provider]  vitamin C (ASCORBIC ACID) 500 MG tablet Take 1 tablet (500 mg total) by mouth daily. 05/12/19   Pincus SanesBurns, Stacy J, MD  Vitamin E 200 units TABS Take 1 tablet by mouth daily. 05/12/19   Pincus SanesBurns, Stacy J, MD    Family History Family History  Problem Relation Age of Onset   Lymphoma Mother  spleen mets to liver and lungs   Irritable bowel syndrome Mother    Basal cell carcinoma Father    Heart disease Father        CABG x 5   Stroke Father    Diabetes Father    Colon polyps Father    Heart attack Father    Hyperlipidemia Father    Hypertension Father    Colon cancer Maternal Grandmother    Irritable bowel syndrome Maternal Aunt    Diabetes Sister    Hypertension Sister    Hyperlipidemia Sister    Polycystic ovary syndrome Sister     Social History Social History   Tobacco Use   Smoking status: Never   Smokeless tobacco: Never  Vaping Use   Vaping Use: Never used  Substance Use Topics   Alcohol use: No   Drug use: No     Allergies   Sulfa antibiotics   Review  of Systems Review of Systems  Constitutional:  Negative for chills and fever.  HENT:  Positive for congestion, postnasal drip, rhinorrhea and sore throat. Negative for ear pain.   Respiratory:  Positive for cough. Negative for shortness of breath.   Gastrointestinal:  Negative for diarrhea and vomiting.  Skin:  Negative for color change and rash.  All other systems reviewed and are negative.    Physical Exam Triage Vital Signs ED Triage Vitals [03/03/22 1039]  Enc Vitals Group     BP 128/84     Pulse Rate 94     Resp 18     Temp 97.7 F (36.5 C)     Temp src      SpO2 97 %     Weight      Height      Head Circumference      Peak Flow      Pain Score      Pain Loc      Pain Edu?      Excl. in GC?    No data found.  Updated Vital Signs BP 128/84   Pulse 94   Temp 97.7 F (36.5 C)   Resp 18   LMP 02/11/2022   SpO2 97%   Visual Acuity Right Eye Distance:   Left Eye Distance:   Bilateral Distance:    Right Eye Near:   Left Eye Near:    Bilateral Near:     Physical Exam Vitals and nursing note reviewed.  Constitutional:      General: She is not in acute distress.    Appearance: Normal appearance. She is well-developed. She is not ill-appearing.  HENT:     Right Ear: Tympanic membrane normal.     Left Ear: Tympanic membrane normal.     Nose: Rhinorrhea present.     Mouth/Throat:     Mouth: Mucous membranes are moist.     Pharynx: Oropharynx is clear.  Cardiovascular:     Rate and Rhythm: Normal rate and regular rhythm.     Heart sounds: Normal heart sounds.  Pulmonary:     Effort: Pulmonary effort is normal. No respiratory distress.     Breath sounds: Normal breath sounds.  Musculoskeletal:     Cervical back: Neck supple.  Skin:    General: Skin is warm and dry.  Neurological:     Mental Status: She is alert.  Psychiatric:        Mood and Affect: Mood normal.        Behavior: Behavior normal.  UC Treatments / Results  Labs (all labs  ordered are listed, but only abnormal results are displayed) Labs Reviewed  NOVEL CORONAVIRUS, NAA    EKG   Radiology No results found.  Procedures Procedures (including critical care time)  Medications Ordered in UC Medications - No data to display  Initial Impression / Assessment and Plan / UC Course  I have reviewed the triage vital signs and the nursing notes.  Pertinent labs & imaging results that were available during my care of the patient were reviewed by me and considered in my medical decision making (see chart for details).   Viral illness.  COVID pending.  Discussed symptomatic treatment including plain Mucinex, Tylenol or ibuprofen, rest, hydration.  Instructed patient to follow up with PCP if symptoms are not improving.  Patient agrees to plan of care.    Final Clinical Impressions(s) / UC Diagnoses   Final diagnoses:  Viral illness     Discharge Instructions      Follow up with your primary care provider if your symptoms are not improving.        ED Prescriptions   None    PDMP not reviewed this encounter.   Mickie Bail, NP 03/03/22 1106

## 2022-03-03 NOTE — ED Triage Notes (Signed)
Patient presents to Urgent Care with complaints of nasal congestion, sore throat, and headache x 1 day. Not treating symptoms with meds.   Denies fever.

## 2022-03-03 NOTE — Discharge Instructions (Addendum)
Follow up with your primary care provider if your symptoms are not improving.     

## 2022-03-04 LAB — NOVEL CORONAVIRUS, NAA: SARS-CoV-2, NAA: NOT DETECTED

## 2022-03-05 ENCOUNTER — Other Ambulatory Visit: Payer: Self-pay | Admitting: Obstetrics and Gynecology

## 2022-03-05 DIAGNOSIS — R102 Pelvic and perineal pain: Secondary | ICD-10-CM

## 2022-07-16 ENCOUNTER — Encounter: Payer: BC Managed Care – PPO | Admitting: Internal Medicine

## 2022-07-16 DIAGNOSIS — M222X1 Patellofemoral disorders, right knee: Secondary | ICD-10-CM | POA: Diagnosis not present

## 2022-07-16 DIAGNOSIS — M25561 Pain in right knee: Secondary | ICD-10-CM | POA: Diagnosis not present

## 2022-07-17 DIAGNOSIS — N9489 Other specified conditions associated with female genital organs and menstrual cycle: Secondary | ICD-10-CM | POA: Diagnosis not present

## 2022-07-17 DIAGNOSIS — Z124 Encounter for screening for malignant neoplasm of cervix: Secondary | ICD-10-CM | POA: Diagnosis not present

## 2022-07-17 DIAGNOSIS — Z01419 Encounter for gynecological examination (general) (routine) without abnormal findings: Secondary | ICD-10-CM | POA: Diagnosis not present

## 2022-07-17 DIAGNOSIS — Z1389 Encounter for screening for other disorder: Secondary | ICD-10-CM | POA: Diagnosis not present

## 2022-07-17 DIAGNOSIS — Z1151 Encounter for screening for human papillomavirus (HPV): Secondary | ICD-10-CM | POA: Diagnosis not present

## 2022-07-29 ENCOUNTER — Encounter (HOSPITAL_BASED_OUTPATIENT_CLINIC_OR_DEPARTMENT_OTHER): Payer: Self-pay | Admitting: Obstetrics and Gynecology

## 2022-07-29 NOTE — Progress Notes (Signed)
Spoke w/ via phone for pre-op interview--- Megan Carrillo Lab needs dos---- CBC, CMP, urine pregnancy (MD ordered T&S, MD also has order to cancel T&S)             Lab results------ COVID test -----patient states asymptomatic no test needed Arrive at ------- 1030 on 08/01/2022 NPO after MN NO Solid Food.  Clear liquids from MN until--- 0930 on 08/01/2022 Med rec completed Medications to take morning of surgery ----- prilosec, xyzal Diabetic medication ----- n/a Patient instructed no nail polish to be worn day of surgery Patient instructed to bring photo id and insurance card day of surgery Patient aware to have Driver (ride ) / caregiver    for 24 hours after surgery - Megan Carrillo (husband) [phone] Patient Special Instructions ----- none Pre-Op special Istructions ----- none Patient verbalized understanding of instructions that were given at this phone interview. Patient denies shortness of breath, chest pain, fever, cough at this phone interview.  Megan Pleasure, RN

## 2022-07-30 NOTE — H&P (Signed)
Megan Carrillo is an 51 y.o. female G1P0010 with h/o endometriosis, pursuing ART with some pelvic pain for bilateral salpingectomy, poss fulguration or excision of endometriosis and/or LOA with robot assistance.  D/W pt r/b/a of surgery, also expectations and process.    Pertinent Gynecological History: G1P0010 (also several chemical pregnancies)  Last pap WNL(07/17/22) MMG 09/25/21 BiRads 1, category C Nl h/o STI  [place] nl anteverted uterus, small fibroids, nl ovaries Menstrual History: Patient's last menstrual period was 07/22/2022.    Past Medical History:  Diagnosis Date   Anxiety    Depression    Endometriosis    GERD (gastroesophageal reflux disease)    Heart murmur    at birth, not detected as adule   History of kidney stones    HLD (hyperlipidemia)    Missed abortion 12/30/2017   PONV (postoperative nausea and vomiting)     Past Surgical History:  Procedure Laterality Date   ANAL FISTULECTOMY  2005   DILATION AND EVACUATION N/A 12/30/2017   Procedure: DILATATION AND EVACUATION;  Surgeon: Sherian Rein, MD;  Location: [facility];  Service: Gynecology;  Laterality: N/A;   ENDOMETRIAL ABLATION Bilateral 2001   with ovarian cyst removal   EYE SURGERY  1979   BMT   LAPAROSCOPIC OVARIAN CYSTECTOMY  2000   URETHRA SURGERY  2010   with stent placed, for kidney stones   URETHRA SURGERY     stent removed  Above is fulguration of endometriosis  Family History  Problem Relation Age of Onset   Lymphoma Mother        spleen mets to liver and lungs   Irritable bowel syndrome Mother    Basal cell carcinoma Father    Heart disease Father        CABG x 5   Stroke Father    Diabetes Father    Colon polyps Father    Heart attack Father    Hyperlipidemia Father    Hypertension Father    Colon cancer Maternal Grandmother    Irritable bowel syndrome Maternal Aunt    Diabetes Sister    Hypertension Sister    Hyperlipidemia Sister    Polycystic ovary syndrome Sister      Social History:  reports that she has never smoked. She has never used smokeless tobacco. She reports that she does not drink alcohol and does not use drugs. Married, Artist  Allergies:  Allergies  Allergen Reactions   Sulfa Antibiotics Other (See Comments)    Patient reports her parents are both highly allergic to sulfa so she has never taken.  Patient states both parents are highly allergic and she has never take anything containing the drug class. Patient states both parents are highly allergic and she has never take anything containing the drug class. Patient reports her parents are both highly allergic to sulfa so she has never taken.    Meds: Allergy, baby ASA, meloxicam prn, PNV  Review of Systems  Constitutional: Negative.   Respiratory: Negative.    Cardiovascular: Negative.   Gastrointestinal: Negative.   Genitourinary:  Positive for pelvic pain.  Musculoskeletal: Negative.   Skin: Negative.   Neurological: Negative.   Psychiatric/Behavioral: Negative.      Height 5\' 3"  (1.6 m), weight 88.9 kg, last menstrual period 07/22/2022. Physical Exam Constitutional:      Appearance: Normal appearance.  HENT:     Head: Normocephalic and atraumatic.  Cardiovascular:     Rate and Rhythm: Normal rate and regular rhythm.  Pulmonary:  Effort: Pulmonary effort is normal.     Breath sounds: Normal breath sounds.  Abdominal:     General: Abdomen is flat.     Palpations: Abdomen is soft.  Genitourinary:    General: Normal vulva.  Musculoskeletal:        General: Normal range of motion.     Cervical back: Normal range of motion and neck supple.  Skin:    General: Skin is warm and dry.  Neurological:     General: No focal deficit present.     Mental Status: She is alert and oriented to person, place, and time.  Psychiatric:        Mood and Affect: Mood normal.        Behavior: Behavior normal.    [place] nl anteverted uterus, sm fibroids, nl  ovaries  Assessment/Plan: 51yo G1P0010 with endometriosis and pursuing ART - robotic assisted operative laparoscopy - B salpingectomy, poss fulguration or excision of endometriosis, and LOA D/w pt r/b/a of surgery, also process and expectations Will proceed  Kolbi Altadonna Bovard-Stuckert 07/30/2022, 4:53 PM

## 2022-07-31 DIAGNOSIS — Z01818 Encounter for other preprocedural examination: Secondary | ICD-10-CM | POA: Diagnosis not present

## 2022-08-01 ENCOUNTER — Ambulatory Visit (HOSPITAL_BASED_OUTPATIENT_CLINIC_OR_DEPARTMENT_OTHER): Payer: BC Managed Care – PPO | Admitting: Anesthesiology

## 2022-08-01 ENCOUNTER — Encounter (HOSPITAL_BASED_OUTPATIENT_CLINIC_OR_DEPARTMENT_OTHER): Payer: Self-pay | Admitting: Obstetrics and Gynecology

## 2022-08-01 ENCOUNTER — Other Ambulatory Visit: Payer: Self-pay

## 2022-08-01 ENCOUNTER — Ambulatory Visit (HOSPITAL_BASED_OUTPATIENT_CLINIC_OR_DEPARTMENT_OTHER)
Admission: RE | Admit: 2022-08-01 | Discharge: 2022-08-01 | Disposition: A | Discharge status: Home / Self Care | Payer: BC Managed Care – PPO | Attending: Obstetrics and Gynecology | Admitting: Obstetrics and Gynecology

## 2022-08-01 ENCOUNTER — Encounter (HOSPITAL_BASED_OUTPATIENT_CLINIC_OR_DEPARTMENT_OTHER)
Admission: RE | Disposition: A | Discharge status: Home / Self Care | Payer: Self-pay | Source: Home / Self Care | Attending: Obstetrics and Gynecology

## 2022-08-01 DIAGNOSIS — E669 Obesity, unspecified: Secondary | ICD-10-CM | POA: Diagnosis not present

## 2022-08-01 DIAGNOSIS — I10 Essential (primary) hypertension: Secondary | ICD-10-CM | POA: Insufficient documentation

## 2022-08-01 DIAGNOSIS — R102 Pelvic and perineal pain: Secondary | ICD-10-CM | POA: Insufficient documentation

## 2022-08-01 DIAGNOSIS — Z6835 Body mass index (BMI) 35.0-35.9, adult: Secondary | ICD-10-CM | POA: Diagnosis not present

## 2022-08-01 DIAGNOSIS — E039 Hypothyroidism, unspecified: Secondary | ICD-10-CM | POA: Insufficient documentation

## 2022-08-01 DIAGNOSIS — Z01818 Encounter for other preprocedural examination: Secondary | ICD-10-CM

## 2022-08-01 DIAGNOSIS — K219 Gastro-esophageal reflux disease without esophagitis: Secondary | ICD-10-CM | POA: Diagnosis not present

## 2022-08-01 DIAGNOSIS — N809 Endometriosis, unspecified: Secondary | ICD-10-CM | POA: Diagnosis not present

## 2022-08-01 HISTORY — PX: XI ROBOT ASSISTED DIAGNOSTIC LAPAROSCOPY: SHX6815

## 2022-08-01 HISTORY — PX: ROBOTIC ASSISTED LAPAROSCOPIC LYSIS OF ADHESION: SHX6080

## 2022-08-01 HISTORY — PX: XI ROBOTIC ASSISTED SALPINGECTOMY: SHX6824

## 2022-08-01 LAB — COMPREHENSIVE METABOLIC PANEL
ALT: 39 U/L (ref 0–44)
AST: 26 U/L (ref 15–41)
Albumin: 4 g/dL (ref 3.5–5.0)
Alkaline Phosphatase: 47 U/L (ref 38–126)
Anion gap: 8 (ref 5–15)
BUN: 10 mg/dL (ref 6–20)
CO2: 24 mmol/L (ref 22–32)
Calcium: 9.2 mg/dL (ref 8.9–10.3)
Chloride: 107 mmol/L (ref 98–111)
Creatinine, Ser: 0.76 mg/dL (ref 0.44–1.00)
GFR, Estimated: >60 mL/min (ref >60)
Glucose, Bld: 95 mg/dL (ref 70–99)
Potassium: 3.8 mmol/L (ref 3.5–5.1)
Sodium: 139 mmol/L (ref 135–145)
Total Bilirubin: 0.7 mg/dL (ref 0.3–1.2)
Total Protein: 7.2 g/dL (ref 6.5–8.1)

## 2022-08-01 LAB — CBC
HCT: 37.8 % (ref 36.0–46.0)
Hemoglobin: 12.9 g/dL (ref 12.0–15.0)
MCH: 29.9 pg (ref 26.0–34.0)
MCHC: 34.1 g/dL (ref 30.0–36.0)
MCV: 87.5 fL (ref 80.0–100.0)
Platelets: 317 10*3/uL (ref 150–400)
RBC: 4.32 MIL/uL (ref 3.87–5.11)
RDW: 12.3 % (ref 11.5–15.5)
WBC: 6.5 10*3/uL (ref 4.0–10.5)
nRBC: 0 % (ref 0.0–0.2)

## 2022-08-01 LAB — POCT PREGNANCY, URINE: Preg Test, Ur: NEGATIVE

## 2022-08-01 SURGERY — LAPAROSCOPY, DIAGNOSTIC, ROBOT-ASSISTED
Anesthesia: General | Site: Abdomen

## 2022-08-01 MED ORDER — ONDANSETRON HCL 4 MG/2ML IJ SOLN
INTRAMUSCULAR | Status: DC | PRN
  Administered 2022-08-01: 4 mg via INTRAVENOUS

## 2022-08-01 MED ORDER — SCOPOLAMINE 1 MG/3DAYS TD PT72
MEDICATED_PATCH | TRANSDERMAL | Status: AC
  Filled 2022-08-01: qty 1

## 2022-08-01 MED ORDER — FENTANYL CITRATE (PF) 100 MCG/2ML IJ SOLN
INTRAMUSCULAR | Status: DC | PRN
  Administered 2022-08-01: 50 ug via INTRAVENOUS
  Administered 2022-08-01 (×2): 100 ug via INTRAVENOUS

## 2022-08-01 MED ORDER — DEXAMETHASONE SODIUM PHOSPHATE 10 MG/ML IJ SOLN
INTRAMUSCULAR | Status: DC | PRN
  Administered 2022-08-01: 10 mg via INTRAVENOUS

## 2022-08-01 MED ORDER — PHENYLEPHRINE HCL (PRESSORS) 10 MG/ML IV SOLN
INTRAVENOUS | Status: AC
  Filled 2022-08-01: qty 1

## 2022-08-01 MED ORDER — POVIDONE-IODINE 10 % EX SWAB: 2.0000 | Freq: Once | CUTANEOUS | Status: DC

## 2022-08-01 MED ORDER — PROPOFOL 10 MG/ML IV BOLUS
INTRAVENOUS | Status: AC
  Filled 2022-08-01: qty 20

## 2022-08-01 MED ORDER — OXYCODONE HCL 5 MG/5ML PO SOLN: 5.0000 mg | Freq: Once | ORAL | Status: DC | PRN

## 2022-08-01 MED ORDER — HYDROMORPHONE HCL 2 MG/ML IJ SOLN
INTRAMUSCULAR | Status: AC
  Filled 2022-08-01: qty 1

## 2022-08-01 MED ORDER — LIDOCAINE 2% (20 MG/ML) 5 ML SYRINGE
INTRAMUSCULAR | Status: DC | PRN
  Administered 2022-08-01: 80 mg via INTRAVENOUS

## 2022-08-01 MED ORDER — PROPOFOL 500 MG/50ML IV EMUL
INTRAVENOUS | Status: DC | PRN
  Administered 2022-08-01: 25 ug/kg/min via INTRAVENOUS

## 2022-08-01 MED ORDER — ONDANSETRON HCL 4 MG/2ML IJ SOLN: 4.0000 mg | Freq: Once | INTRAMUSCULAR | Status: DC | PRN

## 2022-08-01 MED ORDER — 0.9 % SODIUM CHLORIDE (POUR BTL) OPTIME
TOPICAL | Status: DC | PRN
  Administered 2022-08-01: 500 mL

## 2022-08-01 MED ORDER — LACTATED RINGERS IV SOLN: INTRAVENOUS | Status: DC

## 2022-08-01 MED ORDER — SUGAMMADEX SODIUM 200 MG/2ML IV SOLN
INTRAVENOUS | Status: DC | PRN
  Administered 2022-08-01: 200 mg via INTRAVENOUS
  Administered 2022-08-01: 100 mg via INTRAVENOUS

## 2022-08-01 MED ORDER — ROCURONIUM BROMIDE 10 MG/ML (PF) SYRINGE
PREFILLED_SYRINGE | INTRAVENOUS | Status: DC | PRN
  Administered 2022-08-01: 80 mg via INTRAVENOUS

## 2022-08-01 MED ORDER — DIPHENHYDRAMINE HCL 50 MG/ML IJ SOLN
INTRAMUSCULAR | Status: DC | PRN
  Administered 2022-08-01: 12.5 mg via INTRAVENOUS

## 2022-08-01 MED ORDER — HYDROMORPHONE HCL 1 MG/ML IJ SOLN
INTRAMUSCULAR | Status: DC | PRN
  Administered 2022-08-01: .5 mg via INTRAVENOUS

## 2022-08-01 MED ORDER — DIPHENHYDRAMINE HCL 50 MG/ML IJ SOLN
INTRAMUSCULAR | Status: AC
  Filled 2022-08-01: qty 1

## 2022-08-01 MED ORDER — LACTATED RINGERS IV SOLN
INTRAVENOUS | Status: DC
  Administered 2022-08-01: 1000 mL via INTRAVENOUS

## 2022-08-01 MED ORDER — MIDAZOLAM HCL 2 MG/2ML IJ SOLN
INTRAMUSCULAR | Status: DC | PRN
  Administered 2022-08-01: 2 mg via INTRAVENOUS

## 2022-08-01 MED ORDER — IBUPROFEN 800 MG PO TABS: 800.0000 mg | ORAL_TABLET | Freq: Three times a day (TID) | ORAL | 1 refills | Status: AC | PRN

## 2022-08-01 MED ORDER — MEPERIDINE HCL 25 MG/ML IJ SOLN
6.2500 mg | INTRAMUSCULAR | Status: DC | PRN
  Administered 2022-08-01: 12.5 mg via INTRAVENOUS

## 2022-08-01 MED ORDER — MIDAZOLAM HCL 2 MG/2ML IJ SOLN
INTRAMUSCULAR | Status: AC
  Filled 2022-08-01: qty 2

## 2022-08-01 MED ORDER — FENTANYL CITRATE (PF) 250 MCG/5ML IJ SOLN
INTRAMUSCULAR | Status: AC
  Filled 2022-08-01: qty 5

## 2022-08-01 MED ORDER — AMISULPRIDE (ANTIEMETIC) 5 MG/2ML IV SOLN: 10.0000 mg | Freq: Once | INTRAVENOUS | Status: DC | PRN

## 2022-08-01 MED ORDER — SCOPOLAMINE 1 MG/3DAYS TD PT72
1.0000 | MEDICATED_PATCH | Freq: Once | TRANSDERMAL | Status: DC
  Administered 2022-08-01: 1.5 mg via TRANSDERMAL

## 2022-08-01 MED ORDER — KETOROLAC TROMETHAMINE 30 MG/ML IJ SOLN
INTRAMUSCULAR | Status: DC | PRN
  Administered 2022-08-01: 30 mg via INTRAVENOUS

## 2022-08-01 MED ORDER — OXYCODONE HCL 5 MG PO TABS: 5.0000 mg | ORAL_TABLET | Freq: Four times a day (QID) | ORAL | 0 refills | Status: AC | PRN

## 2022-08-01 MED ORDER — KETOROLAC TROMETHAMINE 30 MG/ML IJ SOLN
INTRAMUSCULAR | Status: AC
  Filled 2022-08-01: qty 1

## 2022-08-01 MED ORDER — PROPOFOL 10 MG/ML IV BOLUS
INTRAVENOUS | Status: DC | PRN
  Administered 2022-08-01: 180 mg via INTRAVENOUS
  Administered 2022-08-01: 20 mg via INTRAVENOUS

## 2022-08-01 MED ORDER — MEPERIDINE HCL 25 MG/ML IJ SOLN
INTRAMUSCULAR | Status: AC
  Filled 2022-08-01: qty 1

## 2022-08-01 MED ORDER — ACETAMINOPHEN 500 MG PO TABS
ORAL_TABLET | ORAL | Status: AC
  Filled 2022-08-01: qty 2

## 2022-08-01 MED ORDER — BUPIVACAINE HCL (PF) 0.25 % IJ SOLN
INTRAMUSCULAR | Status: DC | PRN
  Administered 2022-08-01: 15 mL

## 2022-08-01 MED ORDER — ACETAMINOPHEN 500 MG PO TABS
1000.0000 mg | ORAL_TABLET | ORAL | Status: AC
  Administered 2022-08-01: 1000 mg via ORAL

## 2022-08-01 MED ORDER — OXYCODONE HCL 5 MG PO TABS: 5.0000 mg | ORAL_TABLET | Freq: Once | ORAL | Status: DC | PRN

## 2022-08-01 MED ORDER — HYDROMORPHONE HCL 1 MG/ML IJ SOLN: 0.2500 mg | INTRAMUSCULAR | Status: DC | PRN

## 2022-08-01 MED ORDER — SODIUM CHLORIDE 0.9 % IR SOLN
Status: DC | PRN
  Administered 2022-08-01: 1000 mL

## 2022-08-01 SURGICAL SUPPLY — 79 items
ADH SKN CLS APL DERMABOND .7 (GAUZE/BANDAGES/DRESSINGS) ×1
APL SRG 38 LTWT LNG FL B (MISCELLANEOUS)
APPLICATOR ARISTA FLEXITIP XL (MISCELLANEOUS) IMPLANT
CANNULA REDUC XI 12-8 STAPL (CANNULA)
CANNULA REDUCER 12-8 DVNC XI (CANNULA) IMPLANT
CATH FOLEY 3WAY  5CC 16FR (CATHETERS)
CATH FOLEY 3WAY 5CC 16FR (CATHETERS) ×1 IMPLANT
CELLS DAT CNTRL 66122 CELL SVR (MISCELLANEOUS) IMPLANT
COVER BACK TABLE 60X90IN (DRAPES) ×1 IMPLANT
COVER TIP SHEARS 8 DVNC (MISCELLANEOUS) ×1 IMPLANT
COVER TIP SHEARS 8MM DA VINCI (MISCELLANEOUS) ×1
DEFOGGER SCOPE WARMER CLEARIFY (MISCELLANEOUS) ×1 IMPLANT
DERMABOND ADVANCED .7 DNX12 (GAUZE/BANDAGES/DRESSINGS) ×1 IMPLANT
DILATOR CANAL MILEX (MISCELLANEOUS) IMPLANT
DRAPE ARM DVNC X/XI (DISPOSABLE) ×4 IMPLANT
DRAPE COLUMN DVNC XI (DISPOSABLE) ×1 IMPLANT
DRAPE DA VINCI XI ARM (DISPOSABLE) ×4
DRAPE DA VINCI XI COLUMN (DISPOSABLE) ×1
DRAPE UTILITY 15X26 TOWEL STRL (DRAPES) ×1 IMPLANT
DURAPREP 26ML APPLICATOR (WOUND CARE) ×1 IMPLANT
ELECT REM PT RETURN 9FT ADLT (ELECTROSURGICAL) ×1
ELECTRODE REM PT RTRN 9FT ADLT (ELECTROSURGICAL) ×1 IMPLANT
GAUZE 4X4 16PLY ~~LOC~~+RFID DBL (SPONGE) ×1 IMPLANT
GLOVE BIO SURGEON STRL SZ 6.5 (GLOVE) ×3 IMPLANT
HEMOSTAT ARISTA ABSORB 3G PWDR (HEMOSTASIS) IMPLANT
HOLDER FOLEY CATH W/STRAP (MISCELLANEOUS) IMPLANT
IRRIG SUCT STRYKERFLOW 2 WTIP (MISCELLANEOUS) ×1
IRRIGATION SUCT STRKRFLW 2 WTP (MISCELLANEOUS) ×1 IMPLANT
IV NS 1000ML (IV SOLUTION) ×1
IV NS 1000ML BAXH (IV SOLUTION) IMPLANT
KIT TURNOVER CYSTO (KITS) ×1 IMPLANT
LEGGING LITHOTOMY PAIR STRL (DRAPES) ×1 IMPLANT
MANIFOLD NEPTUNE II (INSTRUMENTS) ×1 IMPLANT
MANIPULATOR UTERINE 7CM CLEARV (MISCELLANEOUS) IMPLANT
NDL INSUFFLATION 14GA 120MM (NEEDLE) ×1 IMPLANT
NEEDLE INSUFFLATION 14GA 120MM (NEEDLE) ×1 IMPLANT
NS IRRIG 500ML POUR BTL (IV SOLUTION) IMPLANT
OBTURATOR OPTICAL STANDARD 8MM (TROCAR) ×1
OBTURATOR OPTICAL STND 8 DVNC (TROCAR) ×1
OBTURATOR OPTICALSTD 8 DVNC (TROCAR) ×1 IMPLANT
OCCLUDER COLPOPNEUMO (BALLOONS) ×1 IMPLANT
PACK ROBOT WH (CUSTOM PROCEDURE TRAY) ×1 IMPLANT
PACK ROBOTIC GOWN (GOWN DISPOSABLE) ×1 IMPLANT
PACK TRENDGUARD 450 HYBRID PRO (MISCELLANEOUS) IMPLANT
PAD OB MATERNITY 4.3X12.25 (PERSONAL CARE ITEMS) ×1 IMPLANT
PAD PREP 24X48 CUFFED NSTRL (MISCELLANEOUS) ×1 IMPLANT
PROTECTOR NERVE ULNAR (MISCELLANEOUS) ×1 IMPLANT
RETRACTOR WND ALEXIS 18 MED (MISCELLANEOUS) IMPLANT
RTRCTR WOUND ALEXIS 18CM MED (MISCELLANEOUS)
RTRCTR WOUND ALEXIS 18CM SML (INSTRUMENTS)
SAVER CELL AAL HAEMONETICS (INSTRUMENTS) IMPLANT
SEAL CANN UNIV 5-8 DVNC XI (MISCELLANEOUS) ×3 IMPLANT
SEAL XI 5MM-8MM UNIVERSAL (MISCELLANEOUS) ×3
SEALER VESSEL DA VINCI XI (MISCELLANEOUS)
SEALER VESSEL EXT DVNC XI (MISCELLANEOUS) IMPLANT
SET IRRIG Y TYPE TUR BLADDER L (SET/KITS/TRAYS/PACK) IMPLANT
SET TRI-LUMEN FLTR TB AIRSEAL (TUBING) ×1 IMPLANT
SET TUBE SMOKE EVAC HIGH FLOW (TUBING) IMPLANT
SPIKE FLUID TRANSFER (MISCELLANEOUS) ×2 IMPLANT
STAPLER CANNULA SEAL DVNC XI (STAPLE) IMPLANT
STAPLER CANNULA SEAL XI (STAPLE)
SUT STRATAFIX PDS+0 CT1 9 (SUTURE) IMPLANT
SUT VIC AB 0 CT1 27 (SUTURE)
SUT VIC AB 0 CT1 27XBRD ANBCTR (SUTURE) IMPLANT
SUT VIC AB 4-0 PS2 18 (SUTURE) ×3 IMPLANT
SUT VICRYL 0 UR6 27IN ABS (SUTURE) IMPLANT
SUT VLOC 180 0 9IN  GS21 (SUTURE)
SUT VLOC 180 0 9IN GS21 (SUTURE) IMPLANT
TIP RUMI ORANGE 6.7MMX12CM (TIP) IMPLANT
TIP UTERINE 5.1X6CM LAV DISP (MISCELLANEOUS) IMPLANT
TIP UTERINE 6.7X10CM GRN DISP (MISCELLANEOUS) IMPLANT
TIP UTERINE 6.7X6CM WHT DISP (MISCELLANEOUS) IMPLANT
TIP UTERINE 6.7X8CM BLUE DISP (MISCELLANEOUS) IMPLANT
TOWEL OR 17X24 6PK STRL BLUE (TOWEL DISPOSABLE) IMPLANT
TOWEL OR 17X26 10 PK STRL BLUE (TOWEL DISPOSABLE) ×1 IMPLANT
TRAY FOLEY W/BAG SLVR 14FR LF (SET/KITS/TRAYS/PACK) IMPLANT
TRENDGUARD 450 HYBRID PRO PACK (MISCELLANEOUS) ×1
TROCAR PORT AIRSEAL 8X120 (TROCAR) IMPLANT
WATER STERILE IRR 1000ML POUR (IV SOLUTION) ×1 IMPLANT

## 2022-08-01 NOTE — Anesthesia Procedure Notes (Signed)
Procedure Name: Intubation Date/Time: 08/01/2022 12:20 PM  Performed by: Mechele Claude, CRNAPre-anesthesia Checklist: Patient identified, Emergency Drugs available, Suction available and Patient being monitored Patient Re-evaluated:Patient Re-evaluated prior to induction Oxygen Delivery Method: Circle system utilized Preoxygenation: Pre-oxygenation with 100% oxygen Induction Type: IV induction Ventilation: Mask ventilation without difficulty Laryngoscope Size: Mac and 3 Grade View: Grade I Tube type: Oral Number of attempts: 1 Airway Equipment and Method: Stylet and Oral airway Placement Confirmation: ETT inserted through vocal cords under direct vision, positive ETCO2 and breath sounds checked- equal and bilateral Secured at: 21 cm Tube secured with: Tape Dental Injury: Teeth and Oropharynx as per pre-operative assessment

## 2022-08-01 NOTE — Anesthesia Postprocedure Evaluation (Signed)
Anesthesia Post Note  Patient: Megan Carrillo  Procedure(s) Performed: XI ROBOT ASSISTED OPERATIVE LAPAROSCOPY (Abdomen) XI ROBOTIC ASSISTED LAPAROSCOPIC LYSIS OF ADHESION, FULGURATION OF ENDOMETRIOSIS (Abdomen) XI ROBOTIC ASSISTED SALPINGECTOMY (Abdomen)     Patient location during evaluation: PACU Anesthesia Type: General Level of consciousness: awake and alert and oriented Pain management: pain level controlled Vital Signs Assessment: post-procedure vital signs reviewed and stable Respiratory status: spontaneous breathing, nonlabored ventilation and respiratory function stable Cardiovascular status: blood pressure returned to baseline and stable Postop Assessment: no apparent nausea or vomiting Anesthetic complications: no   No notable events documented.  Last Vitals:  Vitals:   08/01/22 1350 08/01/22 1357  BP: 138/75   Pulse: 81 71  Resp: 17 16  Temp:  36.6 C  SpO2: 90% 97%    Last Pain:  Vitals:   08/01/22 1055  TempSrc: Oral  PainSc: 7                  Gelisa Tieken A.

## 2022-08-01 NOTE — Transfer of Care (Signed)
Immediate Anesthesia Transfer of Care Note  Patient: Megan Carrillo  Procedure(s) Performed: Procedure(s) (LRB): XI ROBOT ASSISTED OPERATIVE LAPAROSCOPY (N/A) XI ROBOTIC ASSISTED LAPAROSCOPIC LYSIS OF ADHESION, FULGURATION OF ENDOMETRIOSIS (N/A) XI ROBOTIC ASSISTED SALPINGECTOMY (N/A)  Patient Location: PACU  Anesthesia Type: General  Level of Consciousness: awake, alert  and oriented  Airway & Oxygen Therapy: Patient Spontanous Breathing   Post-op Assessment: Report given to PACU RN and Post -op Vital signs reviewed and stable  Post vital signs: Reviewed and stable  Complications: No apparent anesthesia complications  Last Vitals:  Vitals Value Taken Time  BP 138/75 08/01/22 1348  Temp 36.6 C 08/01/22 1357  Pulse 68 08/01/22 1359  Resp 13 08/01/22 1359  SpO2 100 % 08/01/22 1359  Vitals shown include unvalidated device data.  Last Pain:  Vitals:   08/01/22 1055  TempSrc: Oral  PainSc: 7       Patients Stated Pain Goal: 8 (08/01/22 1055)  Complications: No notable events documented.

## 2022-08-01 NOTE — Brief Op Note (Signed)
08/01/2022  2:02 PM  PATIENT:  Megan Carrillo-Auth  51 y.o. female  PRE-OPERATIVE DIAGNOSIS:  pain in pelvis, upcoming ART procedure  POST-OPERATIVE DIAGNOSIS:  pain in pelvis, upcoming ART procedure  PROCEDURE:  Procedure(s): XI ROBOT ASSISTED OPERATIVE LAPAROSCOPY (N/A) XI ROBOTIC ASSISTED LAPAROSCOPIC LYSIS OF ADHESION, FULGURATION OF ENDOMETRIOSIS (N/A) XI ROBOTIC ASSISTED SALPINGECTOMY (N/A)  SURGEON:  Surgeon(s) and Role:    * Bovard-Stuckert, Hawa Henly, MD - Primary  ASSISTANTS: Karmen Stabs RNFA   ANESTHESIA:   local and general  EBL:  20 mL IVF and uop per anesthesia  DRAINS: none   LOCAL MEDICATIONS USED:  MARCAINE     SPECIMEN:  Source of Specimen:  B tubal segments  DISPOSITION OF SPECIMEN:  PATHOLOGY  COUNTS:  YES  TOURNIQUET:  * No tourniquets in log *  DICTATION: .Other Dictation: Dictation Number [id number]  PLAN OF CARE: Discharge to home after PACU  PATIENT DISPOSITION:  PACU - hemodynamically stable.   Delay start of Pharmacological VTE agent (>24hrs) due to surgical blood loss or risk of bleeding: not applicable

## 2022-08-01 NOTE — Discharge Instructions (Addendum)
°  Post Anesthesia Home Care Instructions ° °Activity: °Get plenty of rest for the remainder of the day. A responsible adult should stay with you for 24 hours following the procedure.  °For the next 24 hours, DO NOT: °-Drive a car °-Operate machinery °-Drink alcoholic beverages °-Take any medication unless instructed by your physician °-Make any legal decisions or sign important papers. ° °Meals: °Start with liquid foods such as gelatin or soup. Progress to regular foods as tolerated. Avoid greasy, spicy, heavy foods. If nausea and/or vomiting occur, drink only clear liquids until the nausea and/or vomiting subsides. Call your physician if vomiting continues. ° °Special Instructions/Symptoms: °Your throat may feel dry or sore from the anesthesia or the breathing tube placed in your throat during surgery. If this causes discomfort, gargle with warm salt water. The discomfort should disappear within 24 hours. ° ° °  DISCHARGE INSTRUCTIONS: Laparoscopy ° °The following instructions have been prepared to help you care for yourself upon your return home today. ° °Wound care: ° Do not get the incision wet for the first 24 hours. The incision should be kept clean and dry. ° The Band-Aids or dressings may be removed the day after surgery. ° Should the incision become sore, red, and swollen after the first week, check with your doctor. ° °Personal hygiene: ° Shower the day after your procedure. ° °Activity and limitations: ° Do NOT drive or operate any equipment today. ° Do NOT lift anything more than 15 pounds for 2-3 weeks after surgery. ° Do NOT rest in bed all day. ° Walking is encouraged. Walk each day, starting slowly with 5-minute walks 3 or 4 times a day. Slowly increase the length of your walks. ° Walk up and down stairs slowly. ° Do NOT do strenuous activities, such as golfing, playing tennis, bowling, running, biking, weight lifting, gardening, mowing, or vacuuming for 2-4 weeks. Ask your doctor when it is okay to  start. ° °Diet: Eat a light meal as desired this evening. You may resume your usual diet tomorrow. ° °Return to work: This is dependent on the type of work you do. For the most part you can return to a desk job within a week of surgery. If you are more active at work, please discuss this with your doctor. ° °What to expect after your surgery: You may have a slight burning sensation when you urinate on the first day. You may have a very small amount of blood in the urine. Expect to have a small amount of vaginal discharge/light bleeding for 1-2 weeks. It is not unusual to have abdominal soreness and bruising for up to 2 weeks. You may be tired and need more rest for about 1 week. You may experience shoulder pain for 24-72 hours. Lying flat in bed may relieve it. ° °Call your doctor for any of the following: ° Develop a fever of 100.4 or greater ° Inability to urinate 6 hours after discharge from hospital ° Severe pain not relieved by pain medications ° Persistent of heavy bleeding at incision site ° Redness or swelling around incision site after a week ° Increasing nausea or vomiting ° °Patient Signature________________________________________ °Nurse Signature_________________________________________  °

## 2022-08-01 NOTE — Interval H&P Note (Signed)
History and Physical Interval Note:  08/01/2022 11:30 AM  Megan Carrillo  has presented today for surgery, with the diagnosis of pain in pelvis.  The various methods of treatment have been discussed with the patient and family. After consideration of risks, benefits and other options for treatment, the patient has consented to  Procedure(s): XI ROBOT ASSISTED DIAGNOSTIC LAPAROSCOPY (N/A) XI ROBOTIC ASSISTED LAPAROSCOPIC LYSIS OF ADHESION, FULGURATION OF ENDOMETRIOSIS (N/A) XI ROBOTIC ASSISTED SALPINGECTOMY (N/A) as a surgical intervention.  The patient's history has been reviewed, patient examined, no change in status, stable for surgery.  I have reviewed the patient's chart and labs.  Questions were answered to the patient's satisfaction.     Megan Carrillo

## 2022-08-01 NOTE — Anesthesia Preprocedure Evaluation (Addendum)
Anesthesia Evaluation  Patient identified by MRN, date of birth, ID band Patient awake    Reviewed: Allergy & Precautions, NPO status , Patient's Chart, lab work & pertinent test results  History of Anesthesia Complications (+) PONV and history of anesthetic complications  Airway Mallampati: III  TM Distance: >3 FB Neck ROM: Full    Dental no notable dental hx. (+) Dental Advisory Given   Pulmonary neg pulmonary ROS   Pulmonary exam normal breath sounds clear to auscultation       Cardiovascular hypertension, Normal cardiovascular exam+ Valvular Problems/Murmurs  Rhythm:Regular Rate:Normal     Neuro/Psych  PSYCHIATRIC DISORDERS Anxiety Depression    negative neurological ROS     GI/Hepatic Neg liver ROS,GERD  Medicated,,  Endo/Other  Hypothyroidism  Obesity  Renal/GU Hx/o renal calculi     Musculoskeletal negative musculoskeletal ROS (+)    Abdominal  (+) + obese  Peds  Hematology negative hematology ROS (+)   Anesthesia Other Findings - HLD  Reproductive/Obstetrics                              Anesthesia Physical Anesthesia Plan  ASA: 2  Anesthesia Plan: General   Post-op Pain Management: Dilaudid IV, Precedex, Tylenol PO (pre-op)* and Ketamine IV*   Induction: Intravenous  PONV Risk Score and Plan: 4 or greater and Ondansetron, Propofol infusion, Midazolam, Scopolamine patch - Pre-op and Treatment may vary due to age or medical condition  Airway Management Planned: Oral ETT  Additional Equipment: None  Intra-op Plan:   Post-operative Plan: Extubation in OR  Informed Consent: I have reviewed the patients History and Physical, chart, labs and discussed the procedure including the risks, benefits and alternatives for the proposed anesthesia with the patient or authorized representative who has indicated his/her understanding and acceptance.     Dental advisory  given  Plan Discussed with: CRNA and Anesthesiologist  Anesthesia Plan Comments:          Anesthesia Quick Evaluation

## 2022-08-01 NOTE — Op Note (Unsigned)
NAME: Megan Carrillo, Megan Carrillo MEDICAL RECORD NO: [id number] ACCOUNT NO: [id number] DATE OF BIRTH: 06-Jun-1971 FACILITY: [facility] LOCATION: [facility] PHYSICIAN: Sherian Rein, MD  Operative Report   DATE OF PROCEDURE: 08/01/2022  PREOPERATIVE DIAGNOSIS:  Pain in pelvis, upcoming ART procedure.   POSTOPERATIVE DIAGNOSIS:  Pain in pelvis, upcoming ART procedure.    PROCEDURE:  Robotic-assisted operative laparoscopy, fulguration of endometriosis, robotic-assisted bilateral salpingectomy.  SURGEON: Sherian Rein, MD, Dr Sharyne Peach as robotic proctor  ASSISTANT:  Georges Lynch RNFA  ANESTHESIA:  Local and general.    ESTIMATED BLOOD LOSS:   Approximately 20 mL  IV FLUID AND URINE OUTPUT:  Per anesthesia.  CONSULTATIONS:  None.  PATHOLOGY:  Bilateral tubal segments.  DESCRIPTION OF PROCEDURE:  After informed consent was reviewed with the patient including risks, benefits and alternatives of the surgical procedure, she was transported to the operating room and placed on the table in supine position.  General  anesthesia was induced and found to be adequate.  She was then placed in the [place] stirrups, prepped and draped in the normal sterile fashion.  After an appropriate timeout was performed using a heavy weighted speculum, a clear view uterine  manipulator was placed.  Gloves and gown were changed.  Attention was turned to the abdominal portion of the case.  Approximately 8 mm infraumbilical incision was made.  The peritoneum was cleared off with hemostat using a Veress needle, pneumoperitoneum  was obtained after passing the hanging drop test with an opening pressure of 0 mmHg.  The trocar was placed under direct visualization.  Accessory ports were placed on both the right and left under direct visualization after brief pelvic survey  performed revealed minimal endometriosis in the cul-de-sac.  The accessory ports and trocars were placed under direct visualization.  The robot was  then docked, I proceeded to sit at the console and removed the left tube with the vessel sealer.  This was  placed in the anterior cul-de-sac.  The right tube was also excised with the vessel sealer, was placed in the cul-de-sac.  Several implants of endometriosis in the right ovarian fossa were fulgurated as well as in the posterior cul-de-sac.  The tubes  were then removed through an 8 mm port.  The pelvic survey was performed.  The cut edges appeared to be hemostatic.  The gas was evacuated from the abdomen and the trocars were removed and ports were closed with 4-0 Vicryl and Dermabond.  The uterine  manipulator was removed from the vagina as well.  The patient tolerated the procedure well.  Sponge, lap, and needle count was correct x2 per the operating staff.     SUJ D: 08/01/2022 2:08:14 pm T: 08/01/2022 9:52:00 pm  JOB: [id number]/ [id number]

## 2022-08-05 ENCOUNTER — Encounter (HOSPITAL_BASED_OUTPATIENT_CLINIC_OR_DEPARTMENT_OTHER): Payer: Self-pay | Admitting: Obstetrics and Gynecology

## 2022-08-05 LAB — SURGICAL PATHOLOGY

## 2022-08-28 DIAGNOSIS — Z03818 Encounter for observation for suspected exposure to other biological agents ruled out: Secondary | ICD-10-CM | POA: Diagnosis not present

## 2022-08-28 DIAGNOSIS — J069 Acute upper respiratory infection, unspecified: Secondary | ICD-10-CM | POA: Diagnosis not present

## 2022-08-28 DIAGNOSIS — J029 Acute pharyngitis, unspecified: Secondary | ICD-10-CM | POA: Diagnosis not present

## 2022-09-09 DIAGNOSIS — Z1231 Encounter for screening mammogram for malignant neoplasm of breast: Secondary | ICD-10-CM | POA: Diagnosis not present

## 2022-09-30 DIAGNOSIS — L249 Irritant contact dermatitis, unspecified cause: Secondary | ICD-10-CM | POA: Diagnosis not present

## 2022-09-30 DIAGNOSIS — L298 Other pruritus: Secondary | ICD-10-CM | POA: Diagnosis not present

## 2022-10-06 DIAGNOSIS — N978 Female infertility of other origin: Secondary | ICD-10-CM | POA: Diagnosis not present

## 2022-10-23 DIAGNOSIS — E289 Ovarian dysfunction, unspecified: Secondary | ICD-10-CM | POA: Diagnosis not present

## 2022-10-24 DIAGNOSIS — Z79899 Other long term (current) drug therapy: Secondary | ICD-10-CM | POA: Diagnosis not present

## 2022-10-24 DIAGNOSIS — L2389 Allergic contact dermatitis due to other agents: Secondary | ICD-10-CM | POA: Diagnosis not present

## 2022-10-27 DIAGNOSIS — E289 Ovarian dysfunction, unspecified: Secondary | ICD-10-CM | POA: Diagnosis not present

## 2022-10-29 ENCOUNTER — Telehealth: Payer: BC Managed Care – PPO | Admitting: Emergency Medicine

## 2022-10-29 DIAGNOSIS — U071 COVID-19: Secondary | ICD-10-CM

## 2022-10-29 NOTE — Progress Notes (Signed)
Virtual Visit Consent   Megan Carrillo, you are scheduled for a virtual visit with a [facility] provider today. Just as with appointments in the office, your consent must be obtained to participate. Your consent will be active for this visit and any virtual visit you may have with one of our providers in the next 365 days. If you have a MyChart account, a copy of this consent can be sent to you electronically.  As this is a virtual visit, video technology does not allow for your provider to perform a traditional examination. This may limit your provider's ability to fully assess your condition. If your provider identifies any concerns that need to be evaluated in person or the need to arrange testing (such as labs, EKG, etc.), we will make arrangements to do so. Although advances in technology are sophisticated, we cannot ensure that it will always work on either your end or our end. If the connection with a video visit is poor, the visit may have to be switched to a telephone visit. With either a video or telephone visit, we are not always able to ensure that we have a secure connection.  By engaging in this virtual visit, you consent to the provision of healthcare and authorize for your insurance to be billed (if applicable) for the services provided during this visit. Depending on your insurance coverage, you may receive a charge related to this service.  I need to obtain your verbal consent now. Are you willing to proceed with your visit today? Megan Carrillo has provided verbal consent on 10/29/2022 for a virtual visit (video or telephone). Montine Circle, PA-C  Date: 10/29/2022 11:40 AM  Virtual Visit via Video Note   I, Montine Circle, connected with  Megan Carrillo  ([id number], July 24, 1971) on 10/29/22 at 11:45 AM EST by a video-enabled telemedicine application and verified that I am speaking with the correct person using two identifiers.  Location: Patient: Virtual Visit Location  Patient: Home Provider: Virtual Visit Location Provider: Home Office   I discussed the limitations of evaluation and management by telemedicine and the availability of in person appointments. The patient expressed understanding and agreed to proceed.    History of Present Illness: Megan Carrillo is a 52 y.o. who identifies as a female who was assigned female at birth, and is being seen today for COVID.  States that she tested positive yesterday.  States that her husband tested positive.  Denies any other medical problems. Reports some cough and body aches.  HPI: HPI  Problems:  Patient Active Problem List   Diagnosis Date Noted   Nausea 05/12/2019   Chest pain 05/12/2019   Sinus congestion 03/24/2019   Anxiety and depression 06/15/2018   GERD (gastroesophageal reflux disease) 08/22/2016   History of nephrolithiasis 08/22/2016   Vitamin D deficiency 08/22/2016    Allergies:  Allergies  Allergen Reactions   Sulfa Antibiotics Other (See Comments)    Patient reports her parents are both highly allergic to sulfa so she has never taken.  Patient states both parents are highly allergic and she has never take anything containing the drug class. Patient states both parents are highly allergic and she has never take anything containing the drug class. Patient reports her parents are both highly allergic to sulfa so she has never taken.   Medications:  Current Outpatient Medications:    acetaminophen (TYLENOL) 325 MG tablet, Take 650 mg by mouth 2 (two) times daily as needed., Disp: , Rfl:  aspirin EC 81 MG tablet, Take 81 mg by mouth daily. Swallow whole., Disp: , Rfl:    ergocalciferol (VITAMIN D2) 50000 units capsule, Take 50,000 Units by mouth every Wednesday., Disp: , Rfl:    ibuprofen (ADVIL) 800 MG tablet, Take 1 tablet (800 mg total) by mouth every 8 (eight) hours as needed for moderate pain., Disp: 45 tablet, Rfl: 1   levocetirizine (XYZAL) 5 MG tablet, Take 5 mg by mouth  daily., Disp: , Rfl:    meloxicam (MOBIC) 7.5 MG tablet, Take 7.5 mg by mouth daily as needed for pain., Disp: , Rfl:    omeprazole (PRILOSEC) 20 MG capsule, Take 1 capsule (20 mg total) by mouth daily., Disp: 30 capsule, Rfl: 3   oxyCODONE (OXY IR/ROXICODONE) 5 MG immediate release tablet, Take 1 tablet (5 mg total) by mouth every 6 (six) hours as needed for severe pain., Disp: 5 tablet, Rfl: 0   Prenatal Vit-Fe Fumarate-FA (PRENATAL VITAMIN PO), Take 1 tablet by mouth daily., Disp: , Rfl:   Observations/Objective: Patient is well-developed, well-nourished in no acute distress.  Resting comfortably at home.  Head is normocephalic, atraumatic.  No labored breathing. Speech is clear and coherent with logical content.  Patient is alert and oriented at baseline.    Assessment and Plan: 1. COVID-19  We discussed treatment options.  Patient is concerned about a 80 week old they have at home, but infant has been around other sick family members with COVID for the past several days.  Discussed that since infant has already had exposure to COVID for the past several days, that it may not help prevent transmission at this point.  Patient doesn't have any significant medical problems.  I did offer to prescribe antiviral therapy, but patient says that she will treat symptomatically.   Follow Up Instructions: I discussed the assessment and treatment plan with the patient. The patient was provided an opportunity to ask questions and all were answered. The patient agreed with the plan and demonstrated an understanding of the instructions.  A copy of instructions were sent to the patient via MyChart unless otherwise noted below.     The patient was advised to call back or seek an in-person evaluation if the symptoms worsen or if the condition fails to improve as anticipated.  Time:  I spent 11 minutes with the patient via telehealth technology discussing the above problems/concerns.    Montine Circle,  PA-C

## 2022-10-29 NOTE — Patient Instructions (Signed)
  Cheral Almas, thank you for joining Montine Circle, PA-C for today's virtual visit.  While this provider is not your primary care provider (PCP), if your PCP is located in our provider database this encounter information will be shared with them immediately following your visit.   [facility] account gives you access to today's visit and all your visits, tests, and labs performed at [facility] " click here if you don't have a [facility] account or go to [email or web address]  Consent: (Patient) ALESHIA CARTELLI provided verbal consent for this virtual visit at the beginning of the encounter.  Current Medications:  Current Outpatient Medications:    acetaminophen (TYLENOL) 325 MG tablet, Take 650 mg by mouth 2 (two) times daily as needed., Disp: , Rfl:    aspirin EC 81 MG tablet, Take 81 mg by mouth daily. Swallow whole., Disp: , Rfl:    ergocalciferol (VITAMIN D2) 50000 units capsule, Take 50,000 Units by mouth every Wednesday., Disp: , Rfl:    ibuprofen (ADVIL) 800 MG tablet, Take 1 tablet (800 mg total) by mouth every 8 (eight) hours as needed for moderate pain., Disp: 45 tablet, Rfl: 1   levocetirizine (XYZAL) 5 MG tablet, Take 5 mg by mouth daily., Disp: , Rfl:    meloxicam (MOBIC) 7.5 MG tablet, Take 7.5 mg by mouth daily as needed for pain., Disp: , Rfl:    omeprazole (PRILOSEC) 20 MG capsule, Take 1 capsule (20 mg total) by mouth daily., Disp: 30 capsule, Rfl: 3   oxyCODONE (OXY IR/ROXICODONE) 5 MG immediate release tablet, Take 1 tablet (5 mg total) by mouth every 6 (six) hours as needed for severe pain., Disp: 5 tablet, Rfl: 0   Prenatal Vit-Fe Fumarate-FA (PRENATAL VITAMIN PO), Take 1 tablet by mouth daily., Disp: , Rfl:    Medications ordered in this encounter:  No orders of the defined types were placed in this encounter.    *If you need refills on other medications prior to your next appointment, please contact your  pharmacy*  Follow-Up: Call back or seek an in-person evaluation if the symptoms worsen or if the condition fails to improve as anticipated.  [facility] [phone]  Other Instructions    If you have been instructed to have an in-person evaluation today at a local Urgent Care facility, please use the link below. It will take you to a list of all of our available [facility], including address, phone number and hours of operation. Please do not delay care.  [facility]  If you or a family member do not have a primary care provider, use the link below to schedule a visit and establish care. When you choose a [facility] primary care physician or advanced practice provider, you gain a long-term partner in health. Find a Primary Care Provider  Learn more about [facility] in-office and virtual care options: [facility]

## 2022-12-30 DIAGNOSIS — Z113 Encounter for screening for infections with a predominantly sexual mode of transmission: Secondary | ICD-10-CM | POA: Diagnosis not present

## 2022-12-30 DIAGNOSIS — N840 Polyp of corpus uteri: Secondary | ICD-10-CM | POA: Diagnosis not present

## 2022-12-30 DIAGNOSIS — N926 Irregular menstruation, unspecified: Secondary | ICD-10-CM | POA: Diagnosis not present

## 2023-01-05 ENCOUNTER — Encounter (HOSPITAL_BASED_OUTPATIENT_CLINIC_OR_DEPARTMENT_OTHER): Payer: Self-pay | Admitting: Obstetrics and Gynecology

## 2023-01-05 DIAGNOSIS — Z01812 Encounter for preprocedural laboratory examination: Secondary | ICD-10-CM | POA: Diagnosis not present

## 2023-01-05 NOTE — Progress Notes (Signed)
Spoke w/ via phone for pre-op interview---[facility] needs dos----  UPT per anesthesia. Surgeon orders pending             Lab results------ COVID test -----patient states asymptomatic no test needed Arrive at -------0530 NPO after MN NO Solid Food.   Med rec completed Medications to take morning of surgery -----NONE Diabetic medication ----- Patient instructed no nail polish to be worn day of surgery Patient instructed to bring photo id and insurance card day of surgery Patient aware to have Driver (ride ) / caregiver Husband Josh   for 24 hours after surgery  Patient Special Instructions ----- Pre-Op special Istructions ----- Patient verbalized understanding of instructions that were given at this phone interview. Patient denies shortness of breath, chest pain, fever, cough at this phone interview.

## 2023-01-16 ENCOUNTER — Ambulatory Visit (HOSPITAL_BASED_OUTPATIENT_CLINIC_OR_DEPARTMENT_OTHER)
Admission: RE | Admit: 2023-01-16 | Payer: BC Managed Care – PPO | Source: Home / Self Care | Admitting: Obstetrics and Gynecology

## 2023-01-16 DIAGNOSIS — Z01818 Encounter for other preprocedural examination: Secondary | ICD-10-CM

## 2023-01-16 SURGERY — DILATATION & CURETTAGE/HYSTEROSCOPY WITH MYOSURE
Anesthesia: General

## 2023-12-12 DIAGNOSIS — M7501 Adhesive capsulitis of right shoulder: Secondary | ICD-10-CM | POA: Diagnosis not present

## 2024-04-12 DIAGNOSIS — Z130 Encounter for screening for diseases of the blood and blood-forming organs and certain disorders involving the immune mechanism: Secondary | ICD-10-CM | POA: Diagnosis not present

## 2024-04-12 DIAGNOSIS — Z1329 Encounter for screening for other suspected endocrine disorder: Secondary | ICD-10-CM | POA: Diagnosis not present

## 2024-04-12 DIAGNOSIS — Z136 Encounter for screening for cardiovascular disorders: Secondary | ICD-10-CM | POA: Diagnosis not present

## 2024-04-12 DIAGNOSIS — N951 Menopausal and female climacteric states: Secondary | ICD-10-CM | POA: Diagnosis not present

## 2024-04-12 DIAGNOSIS — R635 Abnormal weight gain: Secondary | ICD-10-CM | POA: Diagnosis not present

## 2024-04-12 DIAGNOSIS — Z131 Encounter for screening for diabetes mellitus: Secondary | ICD-10-CM | POA: Diagnosis not present

## 2024-08-05 LAB — HM COLONOSCOPY

## 2024-09-12 ENCOUNTER — Ambulatory Visit: Payer: Self-pay

## 2024-10-01 ENCOUNTER — Emergency Department

## 2024-10-01 ENCOUNTER — Other Ambulatory Visit: Payer: Self-pay

## 2024-10-01 ENCOUNTER — Emergency Department: Admission: EM | Admit: 2024-10-01 | Discharge: 2024-10-01 | Disposition: A | Discharge status: Home / Self Care

## 2024-10-01 DIAGNOSIS — R110 Nausea: Secondary | ICD-10-CM | POA: Diagnosis not present

## 2024-10-01 DIAGNOSIS — R079 Chest pain, unspecified: Secondary | ICD-10-CM | POA: Insufficient documentation

## 2024-10-01 DIAGNOSIS — R6883 Chills (without fever): Secondary | ICD-10-CM | POA: Insufficient documentation

## 2024-10-01 LAB — CBC
HCT: 39.6 % (ref 36.0–46.0)
Hemoglobin: 13.8 g/dL (ref 12.0–15.0)
MCH: 29.6 pg (ref 26.0–34.0)
MCHC: 34.8 g/dL (ref 30.0–36.0)
MCV: 84.8 fL (ref 80.0–100.0)
Platelets: 235 K/uL (ref 150–400)
RBC: 4.67 MIL/uL (ref 3.87–5.11)
RDW: 12.3 % (ref 11.5–15.5)
WBC: 7.9 K/uL (ref 4.0–10.5)
nRBC: 0 % (ref 0.0–0.2)

## 2024-10-01 LAB — BASIC METABOLIC PANEL WITH GFR
Anion gap: 8 (ref 5–15)
BUN: 10 mg/dL (ref 6–20)
CO2: 26 mmol/L (ref 22–32)
Calcium: 9.4 mg/dL (ref 8.9–10.3)
Chloride: 104 mmol/L (ref 98–111)
Creatinine, Ser: 0.72 mg/dL (ref 0.44–1.00)
GFR, Estimated: >60 mL/min
Glucose, Bld: 96 mg/dL (ref 70–99)
Potassium: 4.3 mmol/L (ref 3.5–5.1)
Sodium: 138 mmol/L (ref 135–145)

## 2024-10-01 LAB — TROPONIN T, HIGH SENSITIVITY: Troponin T High Sensitivity: <15 ng/L (ref 0–19)

## 2024-10-01 MED ORDER — SUCRALFATE 1 GM/10ML PO SUSP
1.0000 g | Freq: Once | ORAL | Status: AC
  Administered 2024-10-01: 1 g via ORAL
  Filled 2024-10-01: qty 10

## 2024-10-01 MED ORDER — SUCRALFATE 1 GM/10ML PO SUSP: 1.0000 g | Freq: Two times a day (BID) | ORAL | 0 refills | Status: AC | PRN

## 2024-10-01 NOTE — ED Provider Notes (Signed)
 "  [facility] Provider Note    Event Date/Time   First MD Initiated Contact with Patient 10/01/24 1525     (approximate)   History   Chest Pain and Chills   HPI  Megan Carrillo is a 54 y.o. female with past medical history of GERD, anxiety, presenting to the emergency department complaining of chest pain with chills and hot flashes for the last 2 hours.  Patient reports that the pain initially began in her chest but now it seems to be more in her abdomen.  She denies any shortness of breath.  She reports some associated nausea.     Physical Exam   Triage Vital Signs: ED Triage Vitals  Encounter Vitals Group     BP 10/01/24 1441 138/81     Girls Systolic BP Percentile --      Girls Diastolic BP Percentile --      Boys Systolic BP Percentile --      Boys Diastolic BP Percentile --      Pulse Rate 10/01/24 1441 81     Resp 10/01/24 1441 20     Temp 10/01/24 1441 98.9 F (37.2 C)     Temp Source 10/01/24 1441 Oral     SpO2 10/01/24 1441 99 %     Weight 10/01/24 1442 165 lb (74.8 kg)     Height 10/01/24 1442 5' 3 (1.6 m)     Head Circumference --      Peak Flow --      Pain Score 10/01/24 1441 6     Pain Loc --      Pain Education --      Exclude from Growth Chart --     Most recent vital signs: Vitals:   10/01/24 1441  BP: 138/81  Pulse: 81  Resp: 20  Temp: 98.9 F (37.2 C)  SpO2: 99%     General: Awake, no distress.  CV:  Good peripheral perfusion.  Resp:  Normal effort.  Abd:  No distention.  Other:     ED Results / Procedures / Treatments   Labs (all labs ordered are listed, but only abnormal results are displayed) Labs Reviewed  BASIC METABOLIC PANEL WITH GFR  CBC  TROPONIN T, HIGH SENSITIVITY     EKG  ED ECG REPORT I, Reche CHRISTELLA Leventhal, the attending physician, personally viewed and interpreted this ECG.  Date: 10/01/2024 Rate: 77 bpm Rhythm: normal sinus rhythm QRS Axis: normal Intervals: normal ST/T  Wave abnormalities: normal Narrative Interpretation: no evidence of acute ischemia    RADIOLOGY Patient's chest x-ray does not show any active disease.    PROCEDURES:  Critical Care performed: No  Procedures   MEDICATIONS ORDERED IN ED: Medications  sucralfate  (CARAFATE ) 1 GM/10ML suspension 1 g (has no administration in time range)     IMPRESSION / MDM / ASSESSMENT AND PLAN / ED COURSE  I reviewed the triage vital signs and the nursing notes.                              Differential diagnosis includes, but is not limited to, ACS, aortic dissection, pulmonary embolism, cardiac tamponade, pneumothorax, pneumonia, pericarditis, myocarditis, GI-related causes including esophagitis/gastritis, and musculoskeletal chest wall pain.     Patient's presentation is most consistent with acute illness / injury with system symptoms.  Patient is a 54 year old female with past medical history of GERD, anxiety, presenting to the emergency department  complaining of chest pain with chills and hot flashes for the last 2 hours.  The patient's workup is overall unremarkable.  Troponin is less than 15.  EKG is unremarkable.  Lab work does not show any findings concerning for infection.  Chest x-ray is unremarkable.  Patient was treated with Carafate .  Patient reported at this time that she would like to be discharged to follow-up outpatient.  Discussed plan for follow-up with her primary care physician within the next few days.  She will be prescribed Carafate  for continued symptoms.  If her symptoms worsen or if she develops any new symptoms she is to return immediately to the emergency department.      FINAL CLINICAL IMPRESSION(S) / ED DIAGNOSES   Final diagnoses:  Chest pain, unspecified type     Rx / DC Orders   ED Discharge Orders          Ordered    sucralfate  (CARAFATE ) 1 GM/10ML suspension  2 times daily PRN        10/01/24 1634             Note:  This document was  prepared using Dragon voice recognition software and may include unintentional dictation errors.   [place] Reche HERO, MD 10/01/24 1807  "

## 2024-10-01 NOTE — ED Triage Notes (Signed)
 Pt to ED for chest pain and hot flashes and chills since last 2 hours. States hx acid reflux. Respirations are unlabored, skin dry.

## 2024-10-01 NOTE — ED Notes (Signed)
 Patient is opting to wait for 10 minutes until nausea improves.

## 2024-10-01 NOTE — ED Notes (Signed)
 Patient declined discharge vital signs.
